# Patient Record
Sex: Female | Born: 1975 | Race: Black or African American | Hispanic: No | Marital: Married
Health system: Midwestern US, Community
[De-identification: ages and names within clinical notes are randomized; demographics above are authoritative.]

## PROBLEM LIST (undated history)

## (undated) DIAGNOSIS — J9801 Acute bronchospasm: Secondary | ICD-10-CM

---

## 2008-07-17 ENCOUNTER — Emergency Department: Payer: Self-pay | Admitting: Emergency Medicine

## 2010-10-09 ENCOUNTER — Emergency Department: Payer: Self-pay | Admitting: Internal Medicine

## 2011-11-19 ENCOUNTER — Emergency Department: Payer: Self-pay | Admitting: Emergency Medicine

## 2012-05-16 ENCOUNTER — Emergency Department: Payer: Self-pay | Admitting: Emergency Medicine

## 2012-05-16 LAB — CBC
HCT: 39.2 % (ref 35.0–47.0)
MCV: 82 fL (ref 80–100)
Platelet: 411 10*3/uL (ref 150–440)
RBC: 4.76 10*6/uL (ref 3.80–5.20)
RDW: 14.9 % — ABNORMAL HIGH (ref 11.5–14.5)
WBC: 7.6 10*3/uL (ref 3.6–11.0)

## 2012-05-16 LAB — COMPREHENSIVE METABOLIC PANEL
Alkaline Phosphatase: 87 U/L (ref 50–136)
BUN: 11 mg/dL (ref 7–18)
Bilirubin,Total: 0.4 mg/dL (ref 0.2–1.0)
Calcium, Total: 8.8 mg/dL (ref 8.5–10.1)
Co2: 27 mmol/L (ref 21–32)
EGFR (African American): >60
SGOT(AST): 16 U/L (ref 15–37)
SGPT (ALT): 20 U/L
Sodium: 139 mmol/L (ref 136–145)

## 2012-05-16 LAB — PREGNANCY, URINE: Pregnancy Test, Urine: NEGATIVE m[IU]/mL

## 2012-11-04 ENCOUNTER — Ambulatory Visit: Payer: Self-pay | Admitting: Obstetrics and Gynecology

## 2012-11-04 LAB — BASIC METABOLIC PANEL
Anion Gap: 9 (ref 7–16)
BUN: 9 mg/dL (ref 7–18)
Co2: 27 mmol/L (ref 21–32)
Creatinine: 0.78 mg/dL (ref 0.60–1.30)
EGFR (African American): >60
Potassium: 3.8 mmol/L (ref 3.5–5.1)
Sodium: 136 mmol/L (ref 136–145)

## 2012-11-04 LAB — CBC
HCT: 20.5 % — ABNORMAL LOW (ref 35.0–47.0)
HGB: 6.1 g/dL — ABNORMAL LOW (ref 12.0–16.0)
MCH: 18.9 pg — ABNORMAL LOW (ref 26.0–34.0)
MCHC: 29.9 g/dL — ABNORMAL LOW (ref 32.0–36.0)
MCV: 63 fL — ABNORMAL LOW (ref 80–100)
Platelet: 482 10*3/uL — ABNORMAL HIGH (ref 150–440)
RBC: 3.24 10*6/uL — ABNORMAL LOW (ref 3.80–5.20)

## 2012-11-09 ENCOUNTER — Observation Stay: Payer: Self-pay | Admitting: Obstetrics and Gynecology

## 2012-11-10 LAB — CBC WITH DIFFERENTIAL/PLATELET
Basophil #: 0.1 10*3/uL (ref 0.0–0.1)
Basophil %: 1.1 %
Basophil %: 1 %
Eosinophil #: 0.1 10*3/uL (ref 0.0–0.7)
Eosinophil %: 1.3 %
Eosinophil %: 1.2 %
HCT: 28.3 % — ABNORMAL LOW (ref 35.0–47.0)
HGB: 9.3 g/dL — ABNORMAL LOW (ref 12.0–16.0)
Lymphocyte %: 39.9 %
Lymphocyte %: 35.9 %
MCH: 22.5 pg — ABNORMAL LOW (ref 26.0–34.0)
MCV: 67 fL — ABNORMAL LOW (ref 80–100)
Monocyte #: 0.6 x10 3/mm (ref 0.2–0.9)
Monocyte %: 8.4 %
Neutrophil #: 3.4 10*3/uL (ref 1.4–6.5)
Neutrophil %: 53.6 %
RBC: 4.14 10*6/uL (ref 3.80–5.20)
WBC: 7 10*3/uL (ref 3.6–11.0)

## 2012-11-11 LAB — BASIC METABOLIC PANEL
Anion Gap: 5 — ABNORMAL LOW (ref 7–16)
BUN: 5 mg/dL — ABNORMAL LOW (ref 7–18)
Calcium, Total: 8.2 mg/dL — ABNORMAL LOW (ref 8.5–10.1)
Chloride: 105 mmol/L (ref 98–107)
Co2: 27 mmol/L (ref 21–32)
Creatinine: 0.78 mg/dL (ref 0.60–1.30)
EGFR (African American): >60
Osmolality: 271 (ref 275–301)

## 2012-11-11 LAB — PATHOLOGY REPORT

## 2013-01-10 IMAGING — CT CT CHEST W/ CM
1 series · 15 of 34 positions shown, 19 images · non-contrast
Comparison: none

REASON FOR EXAM: chest pain, elevated d-dimer
COMMENTS:

PROCEDURE:     CT  - CT CHEST (FOR PE) W  - May 16, 2012 [DATE]
RESULT:     Chest CT dated 05/16/2012.
TECHNIQUE: Helical 3 mm sections were obtained the thoracic inlet through
the lung bases status post intravenous administration of 85 mL of Ksovue-SNU.

[Series 4: soft tissue · axial · 0.68mm/px · z∈[-313,-70]mm · 15 of 97 slices shown, 19 images]
[im 8/97  mediastinal]
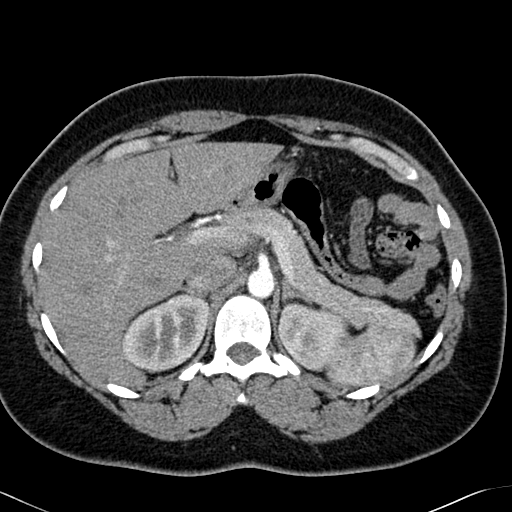
[im 8/97  lung]
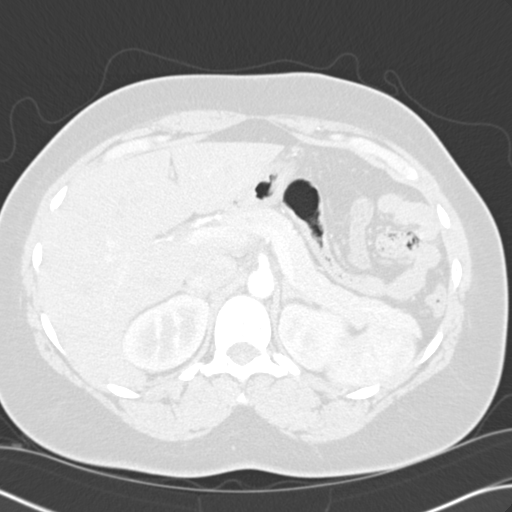
[im 15/97  lung]
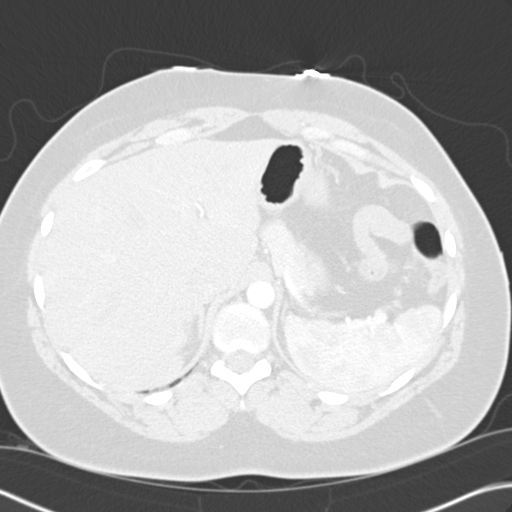
[im 20/97  lung]
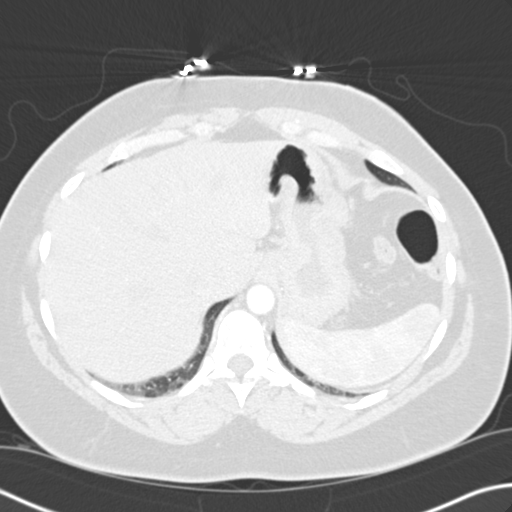
[im 25/97  lung]
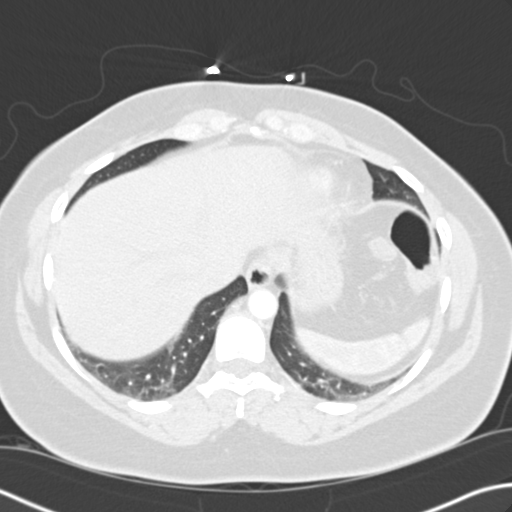
[im 33/97  mediastinal]
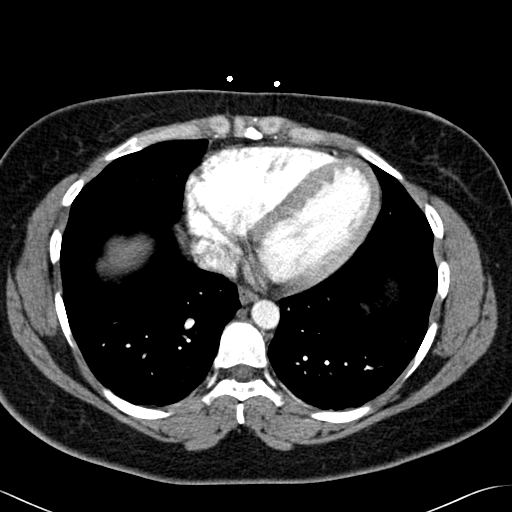
[im 33/97  lung]
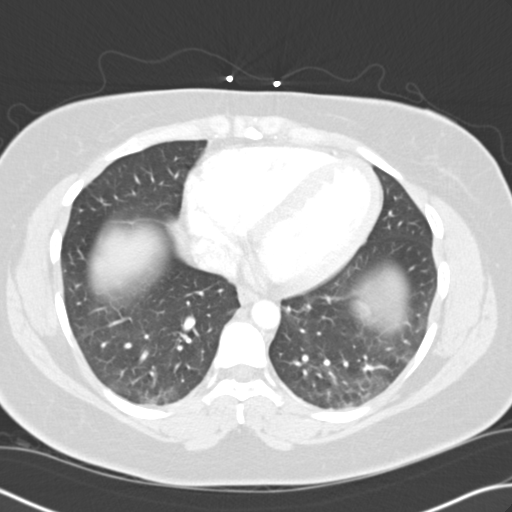
[im 39/97  lung]
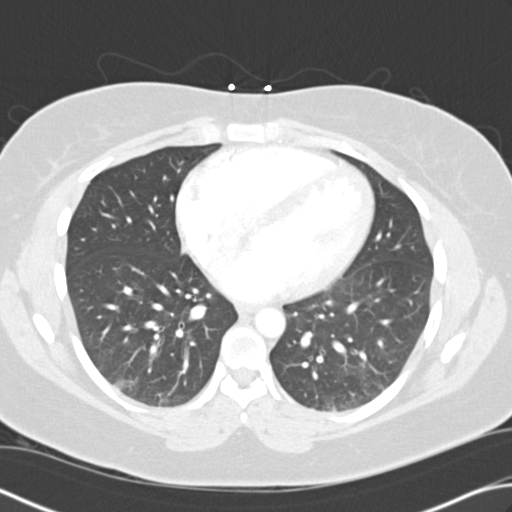
[im 43/97  lung]
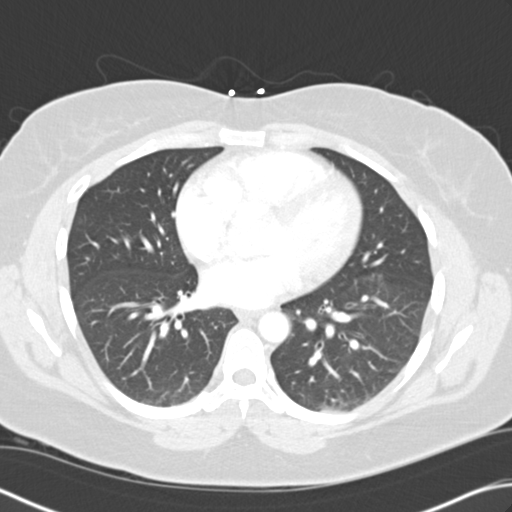
[im 50/97  lung]
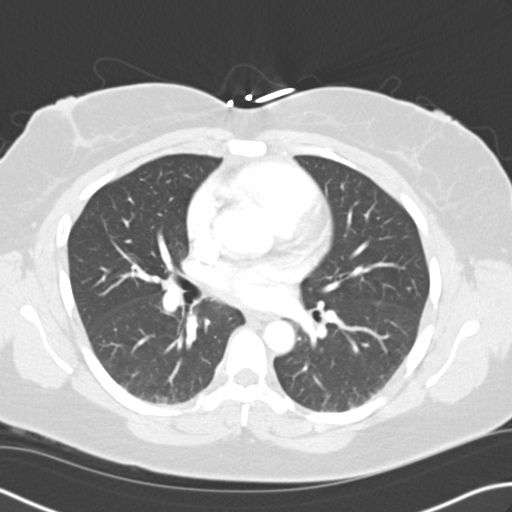
[im 54/97  mediastinal]
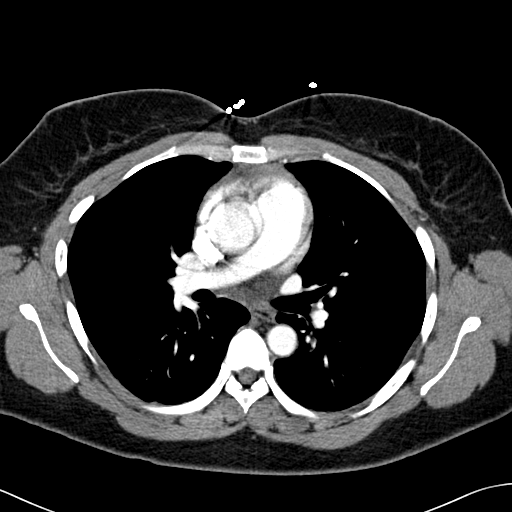
[im 54/97  lung]
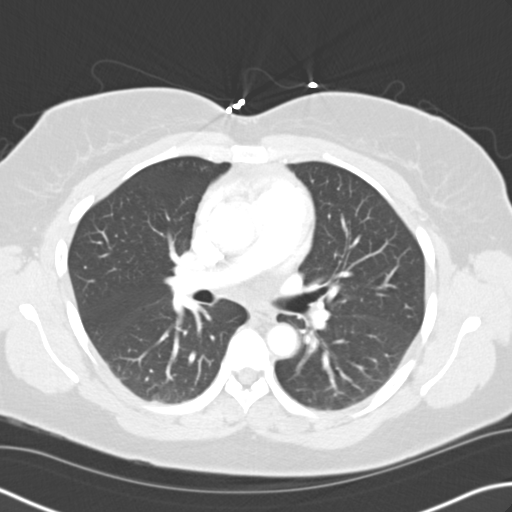
[im 58/97  lung]
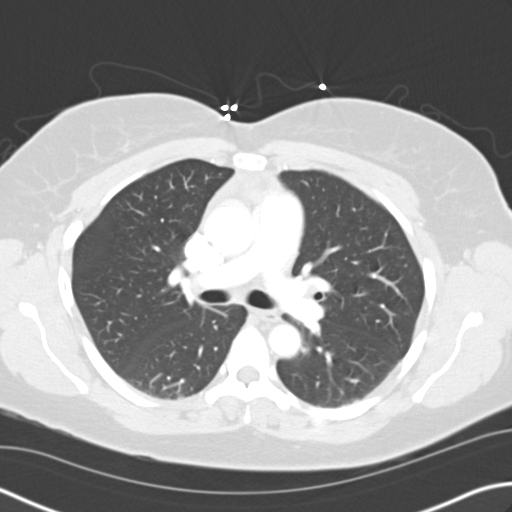
[im 65/97  lung]
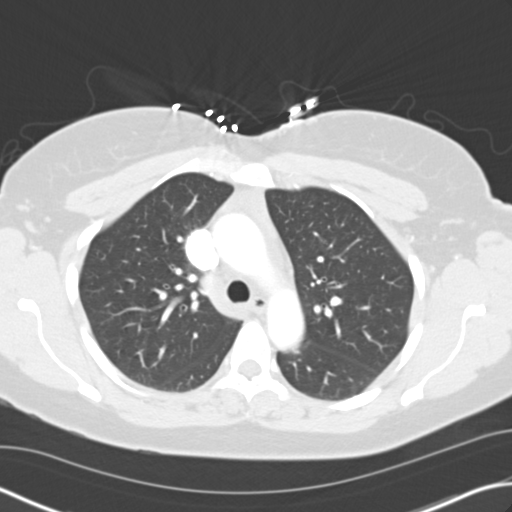
[im 72/97  lung]
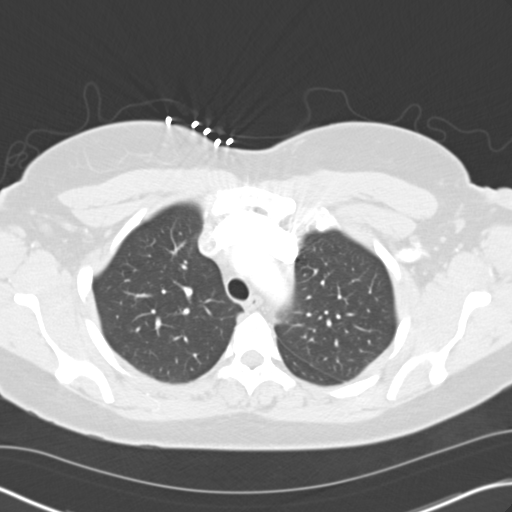
[im 77/97  mediastinal]
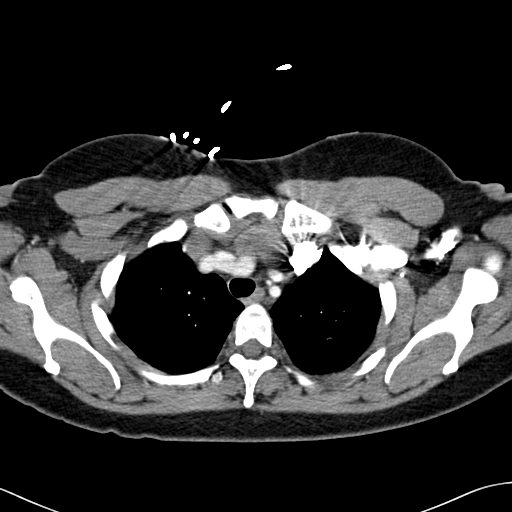
[im 77/97  lung]
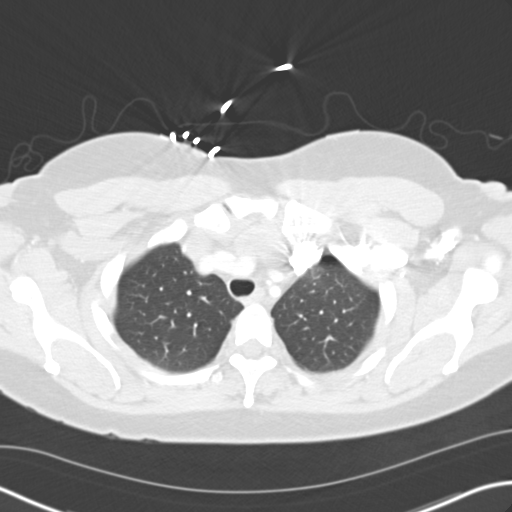
[im 82/97  lung]
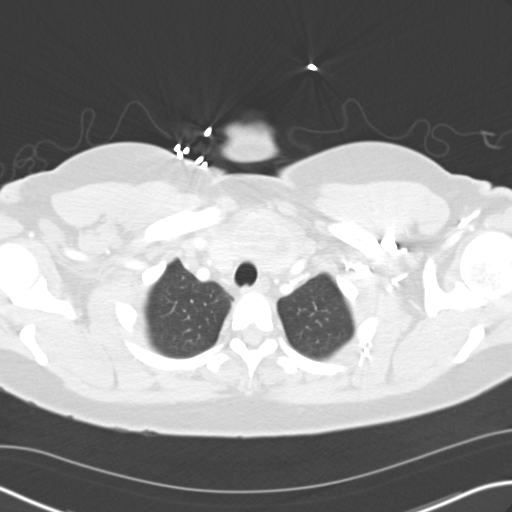
[im 89/97  lung]
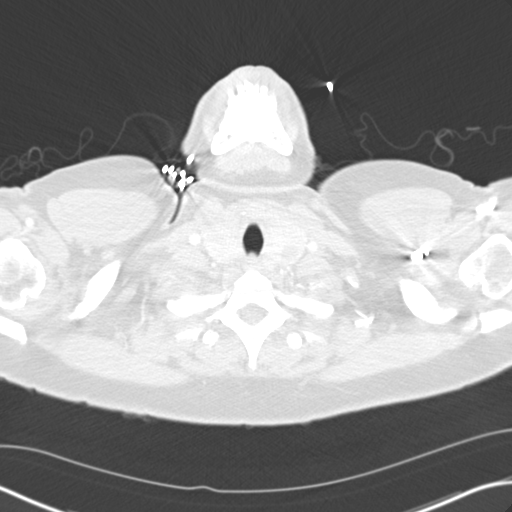

[15 of 34 positions shown; findings below may reference images not displayed]

FINDINGS: The mediastinum and hilar regions and structures demonstrate no
evidence of mediastinal nor hilar adenopathy nor masses.

Evaluate the thoracic inlet demonstrates diffuse enlargement of the thyroid
with masslike enlargement along the inferior aspect of the left lobe of the
thyroid. If clinically warranted further evaluation with schedule thyroid
ultrasound is recommended and correlation with thyroid function tests.

There is no evidence of filling defects within the main, lobar, or segmental
pulmonary arteries. The lung parenchyma demonstrates hypoventilation within
the lung bases. No focal regions of consolidation no focal masses or nodules
identified. The finds within the lung base alternatively may represent mild
infiltrates.

Visualized upper abdominal viscera demonstrates no gross abnormalities.
IMPRESSION: 1. No CT evidence of pulmonary nodule embolic disease
2. Diffuse enlargement of the thyroid with a focal masslike appearance along
the inferior pole on the left further evaluation as described above.
3. Likely hypoventilation in the lung bases.

## 2014-03-05 ENCOUNTER — Emergency Department: Payer: Self-pay | Admitting: Emergency Medicine

## 2014-03-05 LAB — BASIC METABOLIC PANEL
Anion Gap: 1 — ABNORMAL LOW (ref 7–16)
BUN: 9 mg/dL (ref 7–18)
CHLORIDE: 102 mmol/L (ref 98–107)
CO2: 33 mmol/L — AB (ref 21–32)
Calcium, Total: 8.7 mg/dL (ref 8.5–10.1)
Creatinine: 0.84 mg/dL (ref 0.60–1.30)
EGFR (Non-African Amer.): >60
GLUCOSE: 101 mg/dL — AB (ref 65–99)
Osmolality: 271 (ref 275–301)
Potassium: 3.9 mmol/L (ref 3.5–5.1)
Sodium: 136 mmol/L (ref 136–145)

## 2014-03-05 LAB — CBC WITH DIFFERENTIAL/PLATELET
Basophil #: 0.1 10*3/uL (ref 0.0–0.1)
Basophil %: 0.9 %
EOS PCT: 1.7 %
Eosinophil #: 0.1 10*3/uL (ref 0.0–0.7)
HCT: 38.9 % (ref 35.0–47.0)
HGB: 13.3 g/dL (ref 12.0–16.0)
Lymphocyte #: 2.9 10*3/uL (ref 1.0–3.6)
Lymphocyte %: 43.5 %
MCH: 27.8 pg (ref 26.0–34.0)
MCHC: 34.2 g/dL (ref 32.0–36.0)
MCV: 81 fL (ref 80–100)
Monocyte #: 0.5 x10 3/mm (ref 0.2–0.9)
Monocyte %: 7 %
Neutrophil #: 3.2 10*3/uL (ref 1.4–6.5)
Neutrophil %: 46.9 %
Platelet: 379 10*3/uL (ref 150–440)
RBC: 4.78 10*6/uL (ref 3.80–5.20)
RDW: 13.9 % (ref 11.5–14.5)
WBC: 6.7 10*3/uL (ref 3.6–11.0)

## 2014-03-05 LAB — URINALYSIS, COMPLETE
BLOOD: NEGATIVE
Bilirubin,UR: NEGATIVE
GLUCOSE, UR: NEGATIVE mg/dL (ref 0–75)
KETONE: NEGATIVE
Nitrite: NEGATIVE
PROTEIN: NEGATIVE
Ph: 6 (ref 4.5–8.0)
Specific Gravity: 1.013 (ref 1.003–1.030)
Squamous Epithelial: 3
WBC UR: 2 /HPF (ref 0–5)

## 2014-03-05 LAB — TROPONIN I: Troponin-I: <0.02 ng/mL

## 2014-10-30 IMAGING — CR DG CHEST 2V
1 series · 2 of 2 positions shown · non-contrast
Comparison: Chest radiograph and chest CT May 16, 2012

CLINICAL DATA: Fever

EXAM:
CHEST  2 VIEW

[Series 1: pa · 0.17mm/px · 2 of 2 slices shown]
[im 1/2]
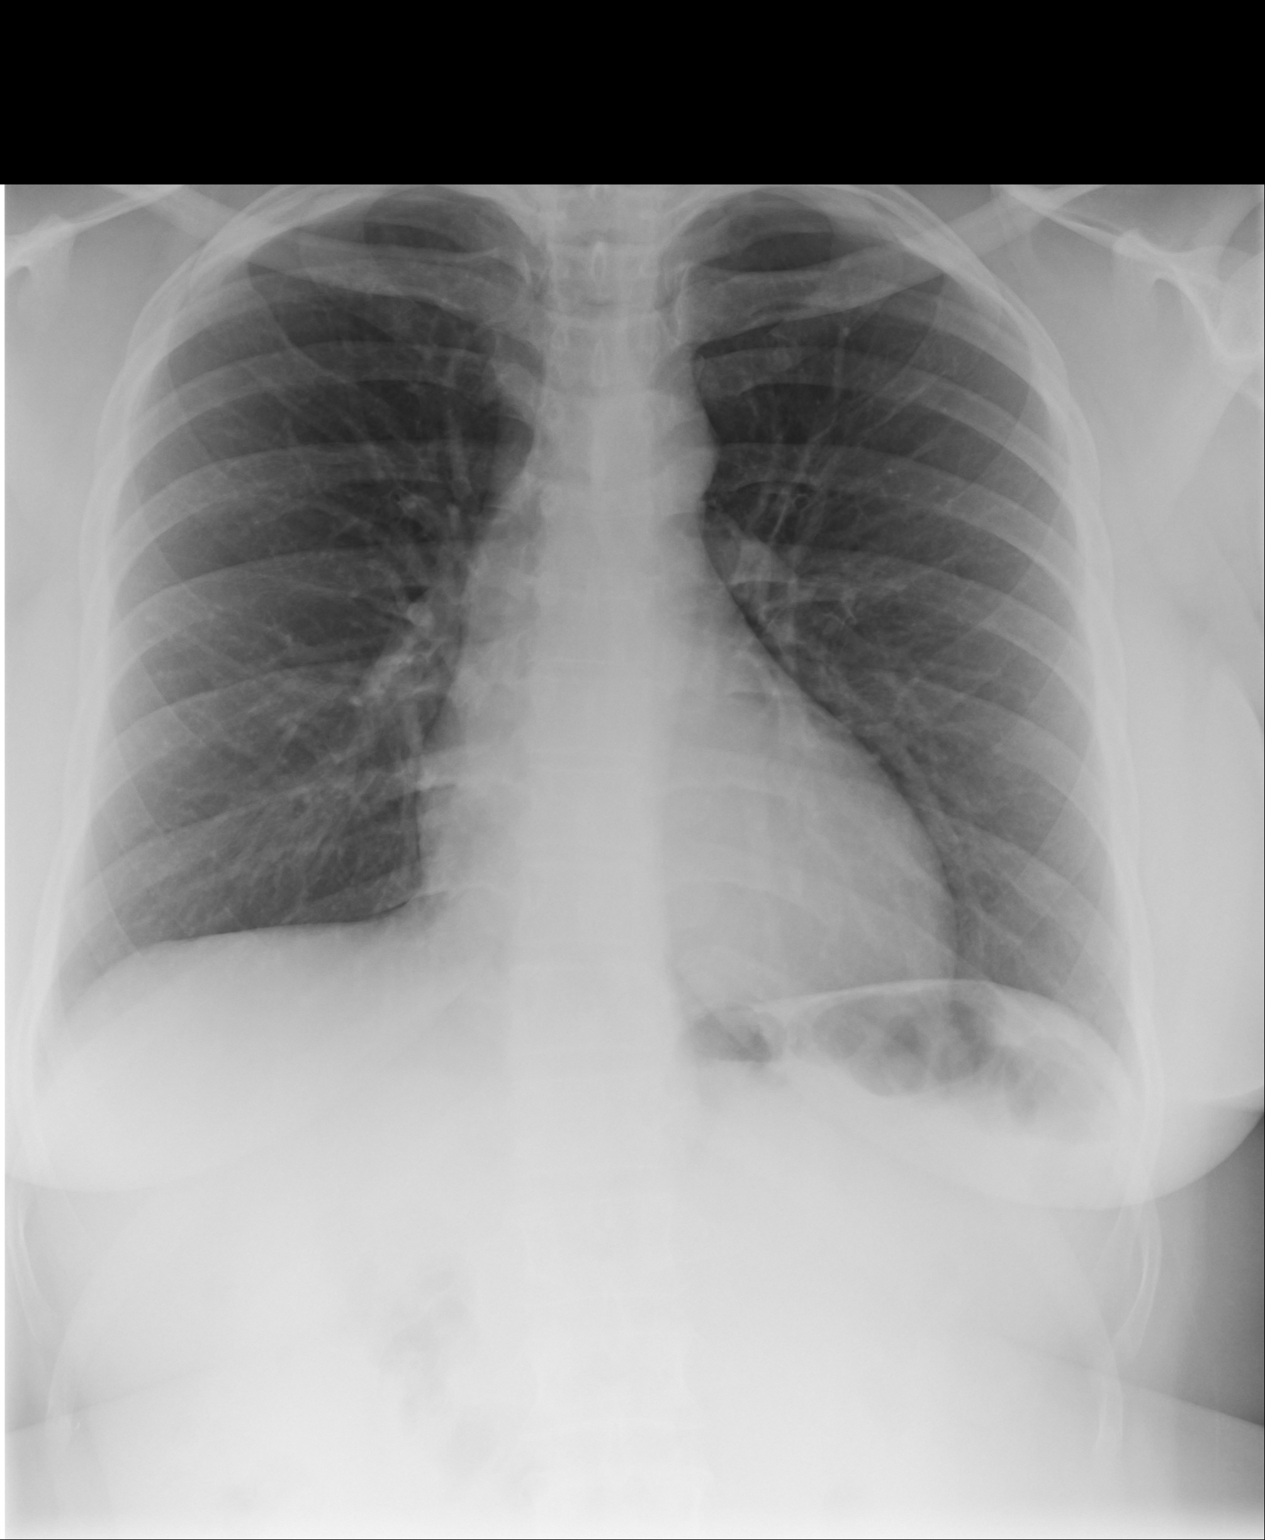
[im 2/2]
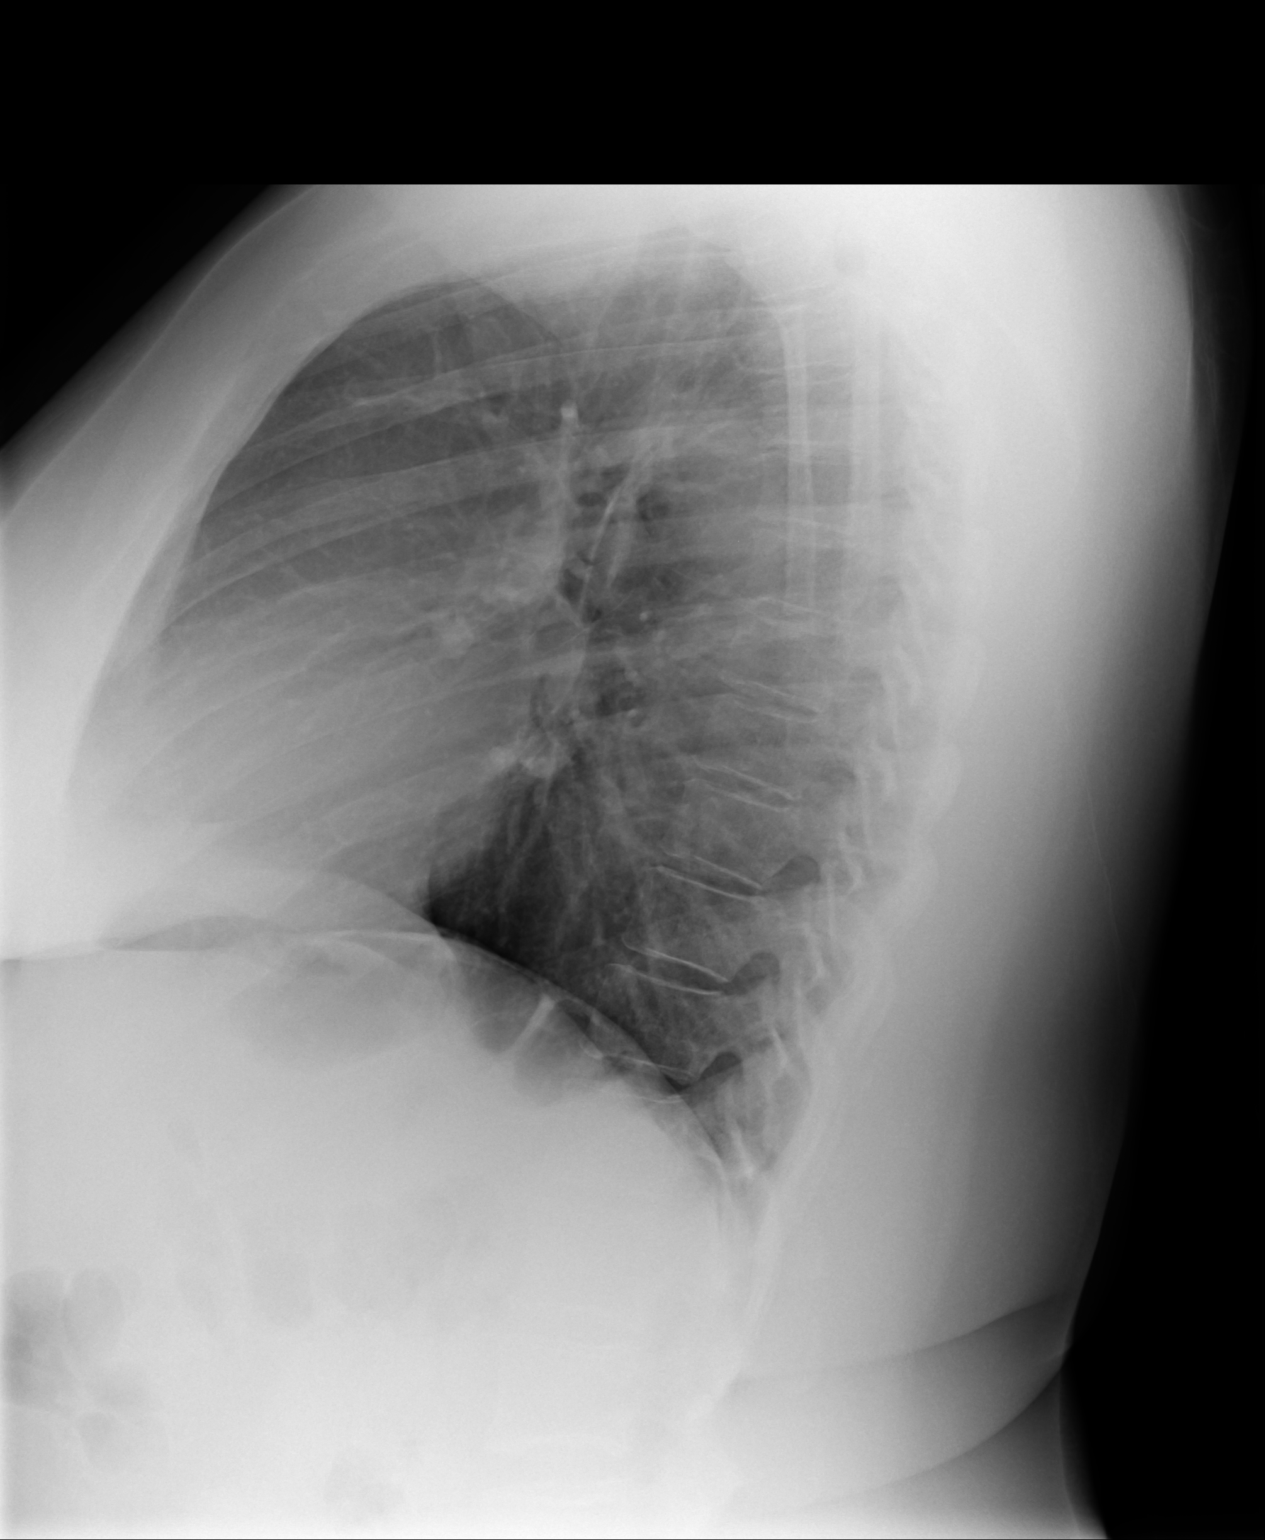

[2 of 2 positions shown; findings below may reference images not displayed]

FINDINGS: Lungs are clear. Heart size and pulmonary vascularity are normal. No
adenopathy. No bone lesions.
IMPRESSION: No abnormality noted.

## 2015-04-11 NOTE — Op Note (Signed)
PATIENT NAME:  Tammy Stanley, Tammy Stanley MR#:  [id number] DATE OF BIRTH:  [birth year]  DATE OF PROCEDURE:  11/10/2012  PREOPERATIVE DIAGNOSES:  1. Menorrhagia to anemia requiring preoperative blood transfusion for a hemoglobin of 6.1. 2. Uterine leiomyomata.   POSTOPERATIVE DIAGNOSES:  1. Menorrhagia to anemia requiring preoperative blood transfusion for a hemoglobin of 6.1. 2. Uterine leiomyomata.   PROCEDURES: Laparoscopic supracervical hysterectomy and cystoscopy.   SURGEON: Vena Austria, MD  ASSISTANT: Kate Sable, MD  ANESTHESIA: General.   ESTIMATED BLOOD LOSS: 50 mL.  OPERATIVE FLUIDS: 1600 mL of crystalloid.   URINE OUTPUT: 150 mL of clear urine.   COMPLICATIONS: None.   INTRAOPERATIVE FINDINGS: Normal tubes, ovaries, small amount of uterine enlargement secondary to known uterine fibroids. Normal bladder on cystoscopy with bilateral ureteral efflux.   PREOPERATIVE ANTIBIOTICS: 2 grams Ancef.  DRAINS OR TUBES: Foley to gravity drainage.   IMPLANTS: None.   SPECIMENS REMOVED: Uterus without cervix, morcellated specimen.  CONDITION FOLLOWING PROCEDURE: Stable.   PROCEDURE IN DETAIL: Risks, benefits, and alternatives of the procedure were discussed with the patient prior to proceeding to the Operating Room. The patient was taken to the Operating Room where general anesthesia was administered. She was positioned in the dorsal lithotomy position, prepped and draped in the usual sterile fashion. A time out was performed. A Foley catheter was placed and a sterile speculum was used to visualize the anterior lip of the cervix, which was grasped with a single-tooth tenaculum. A Hulka tenaculum was then placed and the single-tooth tenaculum was removed. The sterile speculum was removed as well. Attention was turned to the patient's abdomen. The umbilicus was infiltrated with 1% lidocaine. Following this a stab incision was made at the base of the umbilicus. Entering the peritoneum was  achieved using a 5 mm XL trocar under direct visualization. Following this pneumoperitoneum was established. A 5 mm right assistant port and an 11 mm left assistant port were then placed under direct visualization. Inspection of the abdomen noted the above findings. Attention was turned to the patient's right cornea. The uteroovarian fallopian tube and round ligament were transected using a 5 mm Harmonic device. The anterior leaf of the broad ligament was then dissected down to the level of the internal cervical os. The uterine artery was skeletonized and then ligated using bipolar energy. Following this the uterine artery was transected. The left adnexal structures were dissected off the uterus in a similar fashion. The anterior leaf of the broad ligament was also dissected down to the level of the internal cervical os. A bladder flap was created. The uterine artery was skeletonized and then ligated using bipolar energy. The uterine artery was then transected. The uterine specimen was then transected off the cervical stump using the 5 mm LigaSure device. Following this the uterus specimen was morcellated using a Gynecare morcellator. Following this the pelvis was irrigated. The pedicles were inspected and noted to be hemostatic. The cervical os was cauterized using bipolar energy. Following this the 11 mm port site was closed using Endo Close device. No fascial defect was noted after closure of the fascia. Attention was then turned to the cystoscopy portion of the case. Cystoscopy revealed bilateral efflux of methylene blue dye from both ureters. The bladder was intact without defects. The 5 mm port sites were then dressed with Dermabond and the 11 mm           port sites closed using a 4-0 Monocryl and Dermabond. Sponge, needle, and instrument counts were correct x2.  The patient tolerated the procedure well and was taken to the Recovery Room in stable condition.  ____________________________ Florina OuAndreas M.  Bonney AidStaebler, MD ams:slb D: 11/10/2012 22:35:06 ET T: 11/11/2012 10:40:04 ET JOB#: [id number]  cc: Florina OuAndreas M. Bonney AidStaebler, MD, <Dictator> Carmel SacramentoANDREAS Cathrine MusterM Nitza Schmid MD ELECTRONICALLY SIGNED 11/26/2012 15:37

## 2019-11-09 NOTE — ED Provider Notes (Signed)
Chest pain        Patient:   Gabrielle Olsen, Gabrielle Olsen             MRN: [id number]            FIN: [id number]               Age:   43 years     Sex:  Female     DOB:  11/14/1976   Associated Diagnoses:   Muscle strain of anterior chest wall   Author:   Caleb Popp      Basic Information   Additional information: Chief Complaint from Nursing Triage Note   Chief Complaint  Chief Complaint: PT c/o CP starting this morning. 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 Non-radiating in the center of her chest when she moves her arms. (11/09/19 11:51:00).      History of Present Illness   The patient presents with This patient states that she had onset of chest pain at approximately 10:00 this morning after lifting a large plate full of corn.  Patient states that the pain worsens with left arm movement.  Denies any history of shortness of breath.  She does have hypertension and hypercholesterolemia but no prior history of coronary disease.  Denies any history of cough fever.  No other complaints.  The onset was 2.5  hours ago and abrupt.  The course/duration of symptoms is constant.  Location: chest. Radiating pain: none. The character of symptoms is sharp.  The degree at onset was moderate.  The degree at maximum was severe.  The degree at present is moderate.  The exacerbating factor is movement.  The relieving factor is none.  Risk factors consist of hypertension, obesity, hyperlipidemia and not coronary artery disease.  Associated symptoms: denies shortness of breath, denies vomiting and denies diaphoresis.        Review of Systems   Constitutional symptoms:  No fever, no chills.    Respiratory symptoms:  No shortness of breath, no cough.    Cardiovascular symptoms:  Chest pain, No palpitations,    Gastrointestinal symptoms:  No abdominal pain,    Musculoskeletal symptoms:  No back pain, no Joint pain.              Additional review of systems information: All other systems reviewed  and otherwise negative.      Health Status   Allergies:    Allergic Reactions (Selected)  Severe  Codeine- Anaphylactic reaction..   Medications:  (Selected)   Inpatient Medications  Ordered  Motrin: 800 mg, 1 tabs, Oral, Once  Norco 325 mg-5 mg oral tablet: 2 tabs, Oral, Once.      Past Medical/ Family/ Social History   Medical history: Reviewed as documented in chart.   Surgical history: Reviewed as documented in chart.   Family history: Not significant.   Social history: Reviewed as documented in chart.   Problem list:    No qualifying data available  .      Physical Examination               Vital Signs   Vital Signs   10/19/2535 64:40 EST Systolic Blood Pressure 347 mmHg  >HHI    Diastolic Blood Pressure 425 mmHg  >HHI    Heart Rate Monitored 78 bpm    Respiratory Rate 17 br/min    SpO2 100 %   .   Measurements   11/09/2019  11:55 EST Body Mass Index est meas 41.23 kg/m2    Body Mass Index Measured 41.23 kg/m2   11/09/2019 11:51 EST Height/Length Measured 167 cm    Weight Dosing 115 kg   .   Basic Oxygen Information   11/09/2019 11:51 EST SpO2 100 %    Oxygen Therapy Room air   .   General:  Alert, no acute distress.    Skin:  Warm, dry.    Cardiovascular:  Regular rate and rhythm, No murmur.    Respiratory:  Lungs are clear to auscultation, respirations are non-labored, breath sounds are equal.    Chest wall:  Patient has reproducible tenderness to the left anterior chest wall with worsening pain with arm abduction.  There is no evidence of any presents..   Musculoskeletal:  Normal ROM, normal strength.    Gastrointestinal:  Soft, Nontender.    Neurological:  Alert and oriented to person, place, time, and situation, normal motor observed, normal speech observed.       Medical Decision Making   Electrocardiogram:  See ECG ED Review.   Radiology results:  Rad Results (ST)   XR Chest 1 View Portable  ?  11/09/19 12:52:12  Chest AP: 11/09/19    INDICATION:Chest pain.    COMPARISON:  None.    FINDINGS:    Lungs/Pleura: Low lung volumes. Bibasilar opacities likely representing  atelectasis. No focal consolidation. No pleural effusions. No pneumothorax.    Cardiomediastinum: Unremarkable.    Bones: No acute osseous abnormality.    IMPRESSION:  Low lung volumes. Bibasilar opacities likely representing atelectasis. No focal  consolidation.  ?  Signed By: Katha Cabal      Reexamination/ Reevaluation   Vital signs   Basic Oxygen Information   11/09/2019 11:51 EST SpO2 100 %    Oxygen Therapy Room air      Patient has evidence of left-sided chest wall strain.  Diagnostic studies were found to be noncontributory      Impression and Plan   Diagnosis   Muscle strain of anterior chest wall (ICD10-CM S29.011A, Discharge, Medical)   Plan   Condition: Stable.    Disposition: Discharged: Time  11/09/2019 15:07:00, to home.    Prescriptions: Launch prescriptions   Pharmacy:  Flexeril 10 mg oral tablet (Prescribe): 10 mg, 1 tabs, Oral, TID, for 10 days, PRN: spasm, 20 tabs, 0 Refill(s)  ibuprofen 800 mg oral tablet (Prescribe): 800 mg, 1 tabs, Oral, TID, for 7 days, PRN: moderate pain (4-7), 20 tabs, 0 Refill(s).    Patient was given the following educational materials: Muscle Strain, Easy-to-Read, Muscle Strain, Easy-to-Read.    Limitations: No work, For  2  days.    Follow up with: Follow-up in Emergency Department , only if needed Medications as prescribed for chest wall strain    Apply local moist heat to the area of discomfort    Bedrest for 48 hours    Follow-up to the emergency room otherwise as needed especially if there is any shortness of breath, fever or severe worsening symptoms.    Counseled: Patient, Regarding diagnosis, Regarding diagnostic results, Regarding treatment plan, Regarding prescription, Patient indicated understanding of instructions.    Signature Line     Electronically Signed on 11/09/2019 03:09 PM EST   ________________________________________________   Soyla Dryer               Modified by: Lynn Ito V on 11/09/2019 03:09 PM EST

## 2019-11-09 NOTE — Discharge Summary (Signed)
ED Clinical Summary                     [address]  [address]  [place], [place] [address]  [phone]          PERSON INFORMATION  Name: Gabrielle Olsen, Gabrielle Olsen Age:  43 Years DOB: 16-Jul-1976   Sex: Female Language: English PCP: PCP,  NONE   Marital Status: Married Phone: [phone] Med Service: Harle Stanford   MRN: [id number] Acct# [id number] Arrival: 11/09/2019 11:45:00   Visit Reason: Chest pain; CP Acuity: 3 LOS: 000 03:37   Address:    [address] [place] [phone]   Diagnosis:    Muscle strain of anterior chest wall  Medications:          New Medications  Printed Prescriptions  cyclobenzaprine (Flexeril 10 mg oral tablet) 1 Tabs Oral (given by mouth) 3 times a day as needed spasm for 10 Days. Refills: 0.  Last Dose:____________________  ibuprofen (ibuprofen 800 mg oral tablet) 1 Tabs Oral (given by mouth) 3 times a day as needed moderate pain (4-7) for 7 Days. Refills: 0.  Last Dose:____________________      Medications Administered During Visit:                Medication Dose Route   ibuprofen 800 mg Oral   HYDROcodone-acetaminophen 2 tabs Oral               Allergies      codeine (Anaphylactic reaction)      Major Tests and Procedures:  The following procedures and tests were performed during your ED visit.  COMMON PROCEDURES%>  COMMON PROCEDURES COMMENTS%>                PROVIDER INFORMATION               Provider Role Assigned Loletha Carrow, [facility] ED Provider 11/09/2019 11:54:45    Willey Blade M ED Provider 11/09/2019 11:55:02 11/09/2019 11:55:05   Chaney Born, RN, Joellyn Haff ED Nurse 11/09/2019 11:56:42        Attending Physician:  Soyla Dryer      Admit Doc  CHAG-MD,  [facility]     Consulting Doc       VITALS INFORMATION  Vital Sign Triage Latest   Temp Oral ORAL_1%> ORAL%>   Temp Temporal TEMPORAL_1%> TEMPORAL%>   Temp Intravascular INTRAVASCULAR_1%> INTRAVASCULAR%>   Temp Axillary AXILLARY_1%> AXILLARY%>   Temp Rectal RECTAL_1%> RECTAL%>   02 Sat 100  % 98 %   Respiratory Rate RATE_1%> RATE%>   Peripheral Pulse Rate PULSE RATE_1%> PULSE RATE%>   Apical Heart Rate HEART RATE_1%> HEART RATE%>   Blood Pressure BLOOD PRESSURE_1%>/ BLOOD PRESSURE_1%>117 mmHg BLOOD PRESSURE%> / BLOOD PRESSURE%>72 mmHg                 Immunizations      No Immunizations Documented This Visit          DISCHARGE INFORMATION   Discharge Disposition: H Outpt-Sent Home   Discharge Location:  Home   Discharge Date and Time:  11/09/2019 15:22:27   ED Checkout Date and Time:  11/09/2019 15:22:27     DEPART REASON INCOMPLETE INFORMATION               Depart Action Incomplete Reason   Interactive View/I&O Recently assessed               Problems  No Problems Documented              Smoking Status      No Smoking Status Documented         PATIENT EDUCATION INFORMATION  Instructions:     Muscle Strain, Easy-to-Read     Follow up:                   With: Address: When:   Follow-up in Emergency Department  , only if needed   Comments:   Medications as prescribed for chest wall strain     Apply local moist heat to the area of discomfort     Bedrest for 48 hours     Follow-up to the emergency room otherwise as needed especially if there is any shortness of breath, fever or severe worsening symptoms              ED PROVIDER DOCUMENTATION     Patient:   Gabrielle, Olsen             MRN: [id number]            FIN: [id number]               Age:   30 years     Sex:  Female     DOB:  [birth year]   Associated Diagnoses:   Muscle strain of anterior chest wall   Author:   Caleb Popp      Basic Information   Additional information: Chief Complaint from Nursing Triage Note   Chief Complaint  Chief Complaint: PT c/o CP starting this morning. 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 Non-radiating in the center of her chest when she moves her arms. (11/09/19 11:51:00).      History of Present Illness   The patient presents with This patient states that she had onset  of chest pain at approximately 10:00 this morning after lifting a large plate full of corn.  Patient states that the pain worsens with left arm movement.  Denies any history of shortness of breath.  She does have hypertension and hypercholesterolemia but no prior history of coronary disease.  Denies any history of cough fever.  No other complaints.  The onset was 2.5  hours ago and abrupt.  The course/duration of symptoms is constant.  Location: chest. Radiating pain: none. The character of symptoms is sharp.  The degree at onset was moderate.  The degree at maximum was severe.  The degree at present is moderate.  The exacerbating factor is movement.  The relieving factor is none.  Risk factors consist of hypertension, obesity, hyperlipidemia and not coronary artery disease.  Associated symptoms: denies shortness of breath, denies vomiting and denies diaphoresis.        Review of Systems   Constitutional symptoms:  No fever, no chills.    Respiratory symptoms:  No shortness of breath, no cough.    Cardiovascular symptoms:  Chest pain, No palpitations,    Gastrointestinal symptoms:  No abdominal pain,    Musculoskeletal symptoms:  No back pain, no Joint pain.              Additional review of systems information: All other systems reviewed and otherwise negative.      Health Status   Allergies:    Allergic Reactions (Selected)  Severe  Codeine- Anaphylactic reaction..   Medications:  (Selected)   Inpatient Medications  Ordered  Motrin: 800 mg, 1 tabs, Oral, Once  Norco 325 mg-5 mg oral tablet: 2 tabs, Oral, Once.      Past Medical/ Family/ Social History   Medical history: Reviewed as documented in chart.   Surgical history: Reviewed as documented in chart.   Family history: Not significant.   Social history: Reviewed as documented in chart.   Problem list:    No qualifying data available  .      Physical Examination               Vital Signs   Vital Signs   11/09/2019 11:51 EST Systolic Blood Pressure 188 mmHg  >HHI     Diastolic Blood Pressure 117 mmHg  >HHI    Heart Rate Monitored 78 bpm    Respiratory Rate 17 br/min    SpO2 100 %   .   Measurements   11/09/2019 11:55 EST Body Mass Index est meas 41.23 kg/m2    Body Mass Index Measured 41.23 kg/m2   11/09/2019 11:51 EST Height/Length Measured 167 cm    Weight Dosing 115 kg   .   Basic Oxygen Information   11/09/2019 11:51 EST SpO2 100 %    Oxygen Therapy Room air   .   General:  Alert, no acute distress.    Skin:  Warm, dry.    Cardiovascular:  Regular rate and rhythm, No murmur.    Respiratory:  Lungs are clear to auscultation, respirations are non-labored, breath sounds are equal.    Chest wall:  Patient has reproducible tenderness to the left anterior chest wall with worsening pain with arm abduction.  There is no evidence of any presents..   Musculoskeletal:  Normal ROM, normal strength.    Gastrointestinal:  Soft, Nontender.    Neurological:  Alert and oriented to person, place, time, and situation, normal motor observed, normal speech observed.       Medical Decision Making   Electrocardiogram:  See ECG ED Review.   Radiology results:  Rad Results (ST)   XR Chest 1 View Portable  ?  11/09/19 12:52:12  Chest AP: 11/09/19    INDICATION:Chest pain.    COMPARISON: None.    FINDINGS:    Lungs/Pleura: Low lung volumes. Bibasilar opacities likely representing  atelectasis. No focal consolidation. No pleural effusions. No pneumothorax.    Cardiomediastinum: Unremarkable.    Bones: No acute osseous abnormality.    IMPRESSION:  Low lung volumes. Bibasilar opacities likely representing atelectasis. No focal  consolidation.  ?  Signed By: Katha CabalKESLER-MD, THOMAS J  .      Reexamination/ Reevaluation   Vital signs   Basic Oxygen Information   11/09/2019 11:51 EST SpO2 100 %    Oxygen Therapy Room air      Patient has evidence of left-sided chest wall strain.  Diagnostic studies were found to be noncontributory      Impression and Plan   Diagnosis   Muscle strain of anterior chest wall  (ICD10-CM S29.011A, Discharge, Medical)   Plan   Condition: Stable.    Disposition: Discharged: Time  11/09/2019 15:07:00, to home.    Prescriptions: Launch prescriptions   Pharmacy:  Flexeril 10 mg oral tablet (Prescribe): 10 mg, 1 tabs, Oral, TID, for 10 days, PRN: spasm, 20 tabs, 0 Refill(s)  ibuprofen 800 mg oral tablet (Prescribe): 800 mg, 1 tabs, Oral, TID, for 7 days, PRN: moderate pain (4-7), 20 tabs, 0 Refill(s).    Patient was given the following educational materials: Muscle Strain, Easy-to-Read,  Muscle Strain, Easy-to-Read.    Limitations: No work, For  2  days.    Follow up with: Follow-up in Emergency Department , only if needed Medications as prescribed for chest wall strain    Apply local moist heat to the area of discomfort    Bedrest for 48 hours    Follow-up to the emergency room otherwise as needed especially if there is any shortness of breath, fever or severe worsening symptoms.    Counseled: Patient, Regarding diagnosis, Regarding diagnostic results, Regarding treatment plan, Regarding prescription, Patient indicated understanding of instructions.

## 2019-11-09 NOTE — ED Notes (Signed)
ED Triage Note       ED Triage Adult Entered On:  11/09/2019 11:55 EST    Performed On:  11/09/2019 11:51 EST by Chaney Born, RN, Joellyn Haff               Triage   Chief Complaint :   PT c/o CP starting this morning. 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 Non-radiating in the center of her chest when she moves her arms.   Numeric Rating Pain Scale :   8   [place] Mode of Arrival :   Ambulance   Infectious Disease Documentation :   Document assessment   Heart Rate Monitored :   78 bpm   Respiratory Rate :   17 br/min   Systolic Blood Pressure :   188 mmHg (>HHI)    Diastolic Blood Pressure :   117 mmHg (>HHI)    SpO2 :   100 %   Oxygen Therapy :   Room air   Patient presentation :   None of the above   Chief Complaint or Presentation suggest infection :   No   Dosing Weight Obtained By :   Patient stated   Weight Dosing :   115 kg(Converted to: 253 lb 9 oz)    Height :   167 cm(Converted to: 5 ft 6 in)    Body Mass Index Dosing :   41 kg/m2   Clayborn Bigness - 11/09/2019 11:51 EST   DCP GENERIC CODE   Tracking Acuity :   3   Tracking Group :   ED [address] Tracking Group   [place], RN, Joellyn Haff - 11/09/2019 11:51 EST   ED General Section :   Document assessment   Pregnancy Status :   Patient denies   ED Allergies Section :   Document assessment   ED Reason for Visit Section :   Document assessment   ED Quick Assessment :   Patient appears awake, alert, oriented to baseline. Skin warm and dry. Moves all extremities. Respiration even and unlabored. Appears in no apparent distress.   Clayborn Bigness - 11/09/2019 11:51 EST   PTA/Triage Treatments   ED PTA Pre-Arrival Service :   [facility]   [place], [place], Joellyn Haff - 11/09/2019 11:51 EST   ID Risk Screen Symptoms   Recent Travel History :   No recent travel   Close Contact with COVID-19 ID :   No   Last 14 days COVID-19 ID :   No   TB Symptom Screen :   No symptoms   C. diff Symptom/History ID :   Neither of the  above   [place], RN, Joellyn Haff - 11/09/2019 11:51 EST   Allergies   (As Of: 11/09/2019 11:55:43 EST)   Allergies (Active)   codeine  Estimated Onset Date:   Unspecified ; Reactions:   Anaphylactic reaction ; Created By:   Chaney Born, RN, Joellyn Haff; Reaction Status:   Active ; Category:   Drug ; Substance:   codeine ; Type:   Allergy ; Severity:   Severe ; Updated By:   Clayborn Bigness; Reviewed Date:   11/09/2019 11:54 EST        Psycho-Social   Last 3 mo, thoughts killing self/others :   Patient denies   Feels Unsafe at Home :   No   ED Behavioral Activity Rating Scale :  4 - Quiet and awake (normal level of activity)   Chaney Born, RN, Joellyn Haff - 11/09/2019 11:51 EST   ED Reason for Visit   (As Of: 11/09/2019 11:55:43 EST)   Diagnoses(Active)    Chest pain  Date:   11/09/2019 ; Diagnosis Type:   Reason For Visit ; Confirmation:   Complaint of ; Clinical Dx:   Chest pain ; Classification:   Medical ; Clinical Service:   Emergency medicine ; Code:   PNED ; Probability:   0 ; Diagnosis Code:   [id number]

## 2019-11-09 NOTE — ED Notes (Signed)
ED Pre-Arrival Note        Pre-Arrival Summary    Name:  Gabrielle Olsen,    Current Date:  11/09/2019 11:48:07 EST  Gender:  Female  Date of Birth:    Age:  43  Pre-Arrival Type:  EMS  ETA:  11/09/2019 11:59:00 EST  Primary Care Physician:    Presenting Problem:  cp with movement  Pre-Arrival User:  Gasper Sells  Referring Source:    Location:  [place]            PreArrival Communication Form  Emergency Department        Additional Patient Information:        Orders:  [    ] CBC                                            [     ] CT Head no contrast  [    ] BMP                                           [     ] CT Abdomen/Pelvis no contrast  [    ] PT/INR                                       [     ] CT Abdomen/Pelvis IV contrast, w/ oral contrast  [    ] Troponin                                   [     ] CT Abdomen/Pelvis IV contrast, no oral contrast  [    ] BNP                                            [     ] See ordersheet  [    ] CXR                                             [     ] Other:__________________________  [    ] EKG

## 2019-11-09 NOTE — ED Notes (Signed)
 ED Patient Summary       ;       [facility] Emergency Department  [address], [place] [address]  [phone]  Discharge Instructions (Patient)  _______________________________________     Name: Gabrielle Olsen, Gabrielle Olsen  DOB: 02/26/1976                   MRN: [id number]                   FIN: [phone]  Reason For Visit: Chest pain; CP  Final Diagnosis: Muscle strain of anterior chest wall     Visit Date: 11/09/2019 11:45:00  Address: [address] [facility] [address]  Phone: [phone]     Emergency Department Providers:         Primary Physician:   RODGERS ILA LULLA Shelvy [facility] would like to thank you for allowing us  to assist you with your healthcare needs. The following includes patient education materials and information regarding your injury/illness.     Follow-up Instructions:  You were seen today on an emergency basis. Please contact your primary care doctor for a follow up appointment. If you received a referral to a specialist doctor, it is important you follow-up as instructed.    It is important that you call your follow-up doctor to schedule and confirm the location of your next appointment. Your doctor may practice at multiple locations. The office location of your follow-up appointment may be different to the one written on your discharge instructions.    If you do not have a primary care doctor, please call [phone] for help in finding a Florie Cassis. [facility] Provider. For help in finding a specialist doctor, please call [phone].    The [facility] "Ask a Nurse" line in staffed by Registered Nurses and is a free service to the community. We are available Monday - Friday from 8am to 5pm to answer your questions about your health. Please call [phone].    If your condition gets worse before your follow-up with your primary care doctor or specialist, please return to the Emergency Department.        Follow Up  Appointments:  Primary Care Provider:      Name: PCP,  NONE      Phone:                  With: Address: When:   Follow-up in Emergency Department  , only if needed   Comments:   Medications as prescribed for chest wall strain     Apply local moist heat to the area of discomfort     Bedrest for 48 hours     Follow-up to the emergency room otherwise as needed especially if there is any shortness of breath, fever or severe worsening symptoms              Printed Prescriptions:    Patient Education Materials:  Discharge Orders          Discharge Patient 11/09/19 15:09:00 EST         Comment:      Muscle Strain, Easy-to-Read     Muscle Strain    A muscle strain (pulled muscle) happens when a muscle is stretched beyond normal length. It happens when a sudden, violent force stretches your muscle too far. Usually, a few of the fibers in your muscle are torn. Muscle strain  is common in athletes. Recovery usually takes 1?2 weeks. Complete healing takes 5?6 weeks.       HOME CARE     Follow the PRICE method of treatment to help your injury get better. Do this the first 2?3 days after the injury:    ? Protect. Protect the muscle to keep it from getting injured again.    ? Rest. Limit your activity and rest the injured body part.    ? Ice. Put ice in a plastic bag. Place a towel between your skin and the bag. Then, apply the ice and leave it on from 15?20 minutes each hour. After the third day, switch to moist heat packs.     ? Compression. Use a splint or elastic bandage on the injured area for comfort. Do not put it on too tightly.    ? Elevate. Keep the injured body part above the level of your heart.      Only take medicine as told by your doctor.     Warm up before doing exercise to prevent future muscle strains.    GET HELP IF:     You have more pain or puffiness (swelling) in the injured area.     You feel numbness, tingling, or notice a loss of strength in the injured area.     MAKE SURE YOU:     Understand these  instructions.      Will watch your condition.     Will get help right away if you are not doing well or get worse.    This information is not intended to replace advice given to you by your health care provider. Make sure you discuss any questions you have with your health care provider.    Document Released: 09/17/2008 Document Revised: 09/29/2013 Document Reviewed: 07/08/2013  Elsevier Interactive Patient Education ?2016 Elsevier Inc.         Allergy Info: codeine     Medication Information:  [facility] provided you with a complete list of medications post discharge, if you have been instructed to stop taking a medication please ensure you also follow up with this information to your Primary Care Physician.  Unless otherwise noted, patient will continue to take medications as prescribed prior to the Emergency Room visit.  Any specific questions regarding your chronic medications and dosages should be discussed with your physician(s) and pharmacist.          cyclobenzaprine (Flexeril 10 mg oral tablet) 1 Tabs Oral (given by mouth) 3 times a day as needed spasm for 10 Days. Refills: 0.  ibuprofen  (ibuprofen  800 mg oral tablet) 1 Tabs Oral (given by mouth) 3 times a day as needed moderate pain (4-7) for 7 Days. Refills: 0.      Medications Administered During Visit:              Medication Dose Route   ibuprofen  800 mg Oral   HYDROcodone-acetaminophen  2 tabs Oral          Major Tests and Procedures:  The following procedures and tests were performed during your ED visit.  COMMON PROCEDURES%>  COMMON PROCEDURES COMMENTS%>          Laboratory Orders  No laboratory orders were placed.              Radiology Orders  Name Status Details   XR Chest 1 View Portable Completed 11/09/19 12:23:00 EST, STAT 1 hour or less, Reason: Chest pain, Transport Mode: Portable, pp_set_radiology_subspecialty  Patient Care Orders  Name Status Details   Discharge Patient Ordered 11/09/19 15:09:00 EST   ED  Assessment Adult Ordered 11/09/19 11:55:44 EST, 11/09/19 11:55:44 EST   ED Secondary Triage Ordered 11/09/19 11:55:44 EST, 11/09/19 11:55:44 EST   ED Triage Adult Completed 11/09/19 11:45:21 EST, 11/09/19 11:45:21 EST       ---------------------------------------------------------------------------------------------------------------------  [facility]) encourages you to self-enroll in the [facility] Patient Portal.  [facility] Patient Portal will allow you to manage your personal health information securely from your own electronic device now and in the future.  To begin your Patient Portal enrollment process, please visit [email or web address] Click on "Sign up now" under [facility].  If you find that you need additional assistance on the [facility] Patient Portal or need a copy of your medical records, please call the [facility] Medical Records Office at [phone].  Comment:

## 2019-11-09 NOTE — ED Notes (Signed)
 ED Patient Education Note     Patient Education Materials Follows:  Musculoskeletal     Muscle Strain    A muscle strain (pulled muscle) happens when a muscle is stretched beyond normal length. It happens when a sudden, violent force stretches your muscle too far. Usually, a few of the fibers in your muscle are torn. Muscle strain is common in athletes. Recovery usually takes 1?2 weeks. Complete healing takes 5?6 weeks.       HOME CARE     Follow the PRICE method of treatment to help your injury get better. Do this the first 2?3 days after the injury:    ? Protect. Protect the muscle to keep it from getting injured again.    ? Rest. Limit your activity and rest the injured body part.    ? Ice. Put ice in a plastic bag. Place a towel between your skin and the bag. Then, apply the ice and leave it on from 15?20 minutes each hour. After the third day, switch to moist heat packs.     ? Compression. Use a splint or elastic bandage on the injured area for comfort. Do not put it on too tightly.    ? Elevate. Keep the injured body part above the level of your heart.      Only take medicine as told by your doctor.     Warm up before doing exercise to prevent future muscle strains.    GET HELP IF:     You have more pain or puffiness (swelling) in the injured area.     You feel numbness, tingling, or notice a loss of strength in the injured area.     MAKE SURE YOU:     Understand these instructions.      Will watch your condition.     Will get help right away if you are not doing well or get worse.    This information is not intended to replace advice given to you by your health care provider. Make sure you discuss any questions you have with your health care provider.    Document Released: 09/17/2008 Document Revised: 09/29/2013 Document Reviewed: 07/08/2013  Elsevier Interactive Patient Education ?2016 Elsevier Inc.

## 2021-06-05 LAB — COVID-19: SARS-CoV-2: NOT DETECTED

## 2021-06-05 NOTE — Discharge Summary (Signed)
 ED Clinical Summary                     [facility]  [address]  [place], [place], [address]  [phone]          PERSON INFORMATION  Name: DANAI, GOTTO Age:  45 Years DOB: [birth year]   Sex: Female Language: English PCP: PCP,  NONE   Marital Status: Married Phone: [phone] Med Service: JENENE DELLIE Sabot   MRN: [id number] Acct# [id number] Arrival: 06/05/2021 10:24:00   Visit Reason: Ear pain; EAR ACHE/CHEST TIGHTNESS Acuity: 4 LOS: 000 02:45   Address:    [address] [place] [address]   Diagnosis:    Cough; Sinusitis  Medications:          New Medications  Printed Prescriptions  amoxicillin-clavulanate (Augmentin 875 mg-125 mg oral tablet) 1 Tabs Oral (given by mouth) 2 times a day for 10 Days. Refills: 0.  Last Dose:____________________      Medications Administered During Visit:                Medication Dose Route   ibuprofen  800 mg Oral   amoxicillin-clavulanate 1 tabs Oral               Allergies      codeine (Anaphylactic reaction)      Major Tests and Procedures:  The following procedures and tests were performed during your ED visit.  COMMON PROCEDURES%>  COMMON PROCEDURES COMMENTS%>                PROVIDER INFORMATION               Provider Role Assigned Sampson HIGHMAN, RN, [place] ED Nurse 06/05/2021 11:12:19    NEYSA SPANNER M-DO ED Provider 06/05/2021 11:54:40    Graylin Pean ED Nurse 06/05/2021 13:02:32        Attending Physician:  MADISON BOUCHARD K-FNP      Admit Doc  HUFF,  MEGAN K-FNP     Consulting Doc       VITALS INFORMATION  Vital Sign Triage Latest   Temp Oral ORAL_1%> ORAL%>   Temp Temporal TEMPORAL_1%> TEMPORAL%>   Temp Intravascular INTRAVASCULAR_1%> INTRAVASCULAR%>   Temp Axillary AXILLARY_1%> AXILLARY%>   Temp Rectal RECTAL_1%> RECTAL%>   02 Sat 100 % 100 %   Respiratory Rate RATE_1%> RATE%>   Peripheral Pulse Rate PULSE RATE_1%> PULSE RATE%>   Apical Heart Rate HEART RATE_1%> HEART RATE%>   Blood Pressure BLOOD PRESSURE_1%>/ BLOOD  PRESSURE_1%>95 mmHg BLOOD PRESSURE%> / BLOOD PRESSURE%>107 mmHg                 Immunizations      No Immunizations Documented This Visit          DISCHARGE INFORMATION   Discharge Disposition: H Outpt-Sent Home   Discharge Location:  Home   Discharge Date and Time:  06/05/2021 13:09:58   ED Checkout Date and Time:  06/05/2021 13:09:58     DEPART REASON INCOMPLETE INFORMATION               Depart Action Incomplete Reason   Interactive View/I&O Recently assessed               Problems      Active           No Chronic Problems              Smoking Status  No Smoking Status Documented         PATIENT EDUCATION INFORMATION  Instructions:     Sinusitis, Adult     Follow up:                   With: Address: When:   [facility] [phone] Within 1 week   Comments:   Call to establish a primary care physician if you don not have one       With: Address: When:   Follow up with primary care provider  Within 1 week   Comments:   Return to ED if symptoms worsen              ED PROVIDER DOCUMENTATION     Patient:   MCKINZY, FULLER             MRN: [id number]            FIN: [id number]               Age:   66 years     Sex:  Female     DOB:  [birth year]   Associated Diagnoses:   Sinusitis; Cough   Author:   NEYSA SPANNER M-DO      Basic Information   Time seen: Provider Seen (ST)   ED Provider/Time:    NEYSA SPANNER M-DO / 06/05/2021 11:54  .   History source: Patient.   Arrival mode: Private vehicle.   History limitation: None.   Additional information: Chief Complaint from Nursing Triage Note   Chief Complaint  Chief Complaint: pt c/o summer cold, left ear pain and barking cough, states when she coughs it causes a pain in her chest. Denies chest pain - states only when she coughs she has the pain. (06/05/21 10:40:00).      History of Present Illness   45 year old otherwise healthy female presents to the emergency department complaining of 2 weeks of sinus pressure, congestion, barking cough, ear pressure.  She  is having increased pain in her left ear.  Her coworkers wanted her to come because of her cough.  She gets pain across her chest from the fall.  Time she laughs and makes her cough.  She is tried taking Xyzal, Flonase, and Vicks without significant relief.  She is vaccinated for COVID-19 and has been previously infected.  Denies fevers and chills but admits to being hot and cold.      Review of Systems   Constitutional symptoms:  Negative except as documented in HPI.   Skin symptoms:  No rash,    Eye symptoms:  Vision unchanged.   ENMT symptoms:  Negative except as documented in HPI.   Respiratory symptoms:  Negative except as documented in HPI.   Cardiovascular symptoms:  No chest pain, no palpitations.    Gastrointestinal symptoms:  No abdominal pain, no nausea, no vomiting, no diarrhea, no rectal bleeding.    Genitourinary symptoms:  No dysuria, no hematuria.    Musculoskeletal symptoms:  No back pain,    Neurologic symptoms:  Headache.             Additional review of systems information: All other systems reviewed and otherwise negative.      Health Status   Allergies:    Allergic Reactions (Selected)  Severe  Codeine- Anaphylactic reaction..   Medications:  (Selected)   Inpatient Medications  Ordered  Motrin : 800 mg, 1 tabs, Oral, Once.      Past Medical/  Family/ Social History   Medical history: Reviewed as documented in chart.   Surgical history: Reviewed as documented in chart.   Family history: Not significant.   Social history: Reviewed as documented in chart.   Problem list:    Active Problems (1)  No Chronic Problems   , per nurse's notes.      Physical Examination               Vital Signs   Vital Signs   06/05/2021 11:09 EDT Respiratory Rate 16 br/min   06/05/2021 10:40 EDT Systolic Blood Pressure 160 mmHg  HI    Diastolic Blood Pressure 95 mmHg  HI    Temperature Oral 36.9 degC    Heart Rate Monitored 94 bpm    Respiratory Rate 18 br/min    SpO2 100 %   .   Measurements   06/05/2021 10:42 EDT Body  Mass Index est meas 41.59 kg/m2    Body Mass Index Measured 41.59 kg/m2   06/05/2021 10:40 EDT Height/Length Measured 167 cm    Weight Dosing 116 kg   .   Basic Oxygen Information   06/05/2021 11:09 EDT Oxygen Therapy Room air   06/05/2021 10:40 EDT Oxygen Therapy Room air    SpO2 100 %   .   General:  Alert, no acute distress.    Skin:  Warm, dry, intact.    Head:  Normocephalic, atraumatic.    Neck:  Supple, trachea midline, Lymphadenopathy: Bilateral, anterior, mild, tender.    Eye:  Extraocular movements are intact, normal conjunctiva.    Ears, nose, mouth and throat:  Oral mucosa moist, no pharyngeal erythema or exudate, Tympanic membrane: Bilateral, mild, bulging, Nose: Bilateral nares, mild, congestion, swelling.    Cardiovascular:  Regular rate and rhythm, Normal peripheral perfusion.    Respiratory:  Lungs are clear to auscultation, respirations are non-labored.    Chest wall:  No tenderness.   Back:  Normal range of motion.   Musculoskeletal:  Normal ROM, no deformity.    Gastrointestinal:  Soft, Nontender, Non distended.    Neurological:  Alert and oriented to person, place, time, and situation, No focal neurological deficit observed.       Medical Decision Making   Rationale:  Chest x-ray unremarkable.  Patient negative for COVID.  Patient symptoms have been going on for more than 2 weeks so we will plan to start treatment for possible bacterial sinusitis.  Will recommend continued symptomatic treatment at home.   Documents reviewed:  Emergency department nurses' notes.   Results review:  Lab results : Lab View   06/05/2021 12:08 EDT      COVID (SARS-CoV-2) Only ([place])            Not Detected  .   Radiology results:  Rad Results (ST)   XR Chest 1 View Portable  ?  06/05/21 12:21:32  PORTABLE AP VIEW OF THE CHEST    DATE: 06/05/21.    INDICATION:Other abnormalities of breathing.    COMPARISON: 11/09/2019    NUMBER OF RADIOGRAPHIC IMAGES: 1    FINDINGS:    Heart and mediastinal contours are within normal  limits.  The lungs are clear.  No evidence of acute osseous abnormality.    IMPRESSION: There is no evidence of acute cardiopulmonary disease.  ?  Signed By: LENON PRENTICE BROOM  .      Impression and Plan   Diagnosis   Sinusitis (ICD10-CM J32.9, Discharge, Medical)   Cough (ICD10-CM R05.9, Discharge, Medical)  Plan   Condition: Stable.    Disposition: Discharged: Time  06/05/2021 12:57:00, to home.    Prescriptions: Launch prescriptions   Pharmacy:  Augmentin 875 mg-125 mg oral tablet (Prescribe): 1 tabs, Oral, BID, for 10 days, 20 tabs, 0 Refill(s).    Patient was given the following educational materials: Sinusitis, Adult.    Follow up with: Follow up with primary care provider Within 1 week Return to ED if symptoms worsen; [facility] Within 1 week Call to establish a primary care physician if you don not have one.    Counseled: Patient, Regarding diagnosis, Regarding diagnostic results, Regarding treatment plan, Regarding prescription, Patient indicated understanding of instructions.

## 2021-06-05 NOTE — ED Notes (Signed)
ED Patient Education Note     Patient Education Materials Follows:  Infectious Disease     Sinusitis, Adult      Sinusitis is inflammation of your sinuses. Sinuses are hollow spaces in the bones around your face. Your sinuses are located:   Around your eyes.     In the middle of your forehead.     Behind your nose.     In your cheekbones.      Mucus normally drains out of your sinuses. When your nasal tissues become inflamed or swollen, mucus can become trapped or blocked. This allows bacteria, viruses, and fungi to grow, which leads to infection. Most infections of the sinuses are caused by a virus.    Sinusitis can develop quickly. It can last for up to 4 weeks (acute) or for more than 12 weeks (chronic). Sinusitis often develops after a cold.      What are the causes?    This condition is caused by anything that creates swelling in the sinuses or stops mucus from draining. This includes:   Allergies.     Asthma.     Infection from bacteria or viruses.     Deformities or blockages in your nose or sinuses.     Abnormal growths in the nose (nasal polyps).     Pollutants, such as chemicals or irritants in the air.     Infection from fungi (rare).        What increases the risk?    You are more likely to develop this condition if you:   Have a weak body defense system (immune system).     Do a lot of swimming or diving.     Overuse nasal sprays.     Smoke.        What are the signs or symptoms?    The main symptoms of this condition are pain and a feeling of pressure around the affected sinuses. Other symptoms include:   Stuffy nose or congestion.     Thick drainage from your nose.     Swelling and warmth over the affected sinuses.     Headache.     Upper toothache.     A cough that may get worse at night.     Extra mucus that collects in the throat or the back of the nose (postnasal drip).     Decreased sense of smell and taste.     Fatigue.     A fever.     Sore throat.     Bad breath.        How is this  diagnosed?    This condition is diagnosed based on:   Your symptoms.     Your medical history.     A physical exam.     Tests to find out if your condition is acute or chronic. This may include:  ? Checking your nose for nasal polyps.    ? Viewing your sinuses using a device that has a light (endoscope).    ? Testing for allergies or bacteria.    ? Imaging tests, such as an MRI or CT scan.        In rare cases, a bone biopsy may be done to rule out more serious types of fungal sinus disease.      How is this treated?    Treatment for sinusitis depends on the cause and whether your condition is chronic or acute.   If caused by a virus,   your symptoms should go away on their own within 10 days. You may be given medicines to relieve symptoms. They include:  ? Medicines that shrink swollen nasal passages (topical intranasal decongestants).     ? Medicines that treat allergies (antihistamines).    ? A spray that eases inflammation of the nostrils (topical intranasal corticosteroids).    ? Rinses that help get rid of thick mucus in your nose (nasal saline washes).       If caused by bacteria, your health care provider may recommend waiting to see if your symptoms improve. Most bacterial infections will get better without antibiotic medicine. You may be given antibiotics if you have:  ? A severe infection.     ? A weak immune system.       If caused by narrow nasal passages or nasal polyps, you may need to have surgery.        Follow these instructions at home:    Medicines     Take, use, or apply over-the-counter and prescription medicines only as told by your health care provider. These may include nasal sprays.     If you were prescribed an antibiotic medicine, take it as told by your health care provider. Do not stop taking the antibiotic even if you start to feel better.      Hydrate and humidify       Drink enough fluid to keep your urine pale yellow. Staying hydrated will help to thin your mucus.     Use a cool mist  humidifier to keep the humidity level in your home above 50%.     Inhale steam for 10?15 minutes, 3?4 times a day, or as told by your health care provider. You can do this in the bathroom while a hot shower is running.     Limit your exposure to cool or dry air.      Rest     Rest as much as possible.     Sleep with your head raised (elevated).     Make sure you get enough sleep each night.      General instructions       Apply a warm, moist washcloth to your face 3?4 times a day or as told by your health care provider. This will help with discomfort.     Wash your hands often with soap and water to reduce your exposure to germs. If soap and water are not available, use hand sanitizer.     Do not smoke. Avoid being around people who are smoking (secondhand smoke).     Keep all follow-up visits as told by your health care provider. This is important.        Contact a health care provider if:     You have a fever.     Your symptoms get worse.     Your symptoms do not improve within 10 days.      Get help right away if:     You have a severe headache.     You have persistent vomiting.     You have severe pain or swelling around your face or eyes.     You have vision problems.     You develop confusion.     Your neck is stiff.     You have trouble breathing.      Summary     Sinusitis is soreness and inflammation of your sinuses. Sinuses are hollow spaces in the bones around your face.       This condition is caused by nasal tissues that become inflamed or swollen. The swelling traps or blocks the flow of mucus. This allows bacteria, viruses, and fungi to grow, which leads to infection.     If you were prescribed an antibiotic medicine, take it as told by your health care provider. Do not stop taking the antibiotic even if you start to feel better.     Keep all follow-up visits as told by your health care provider. This is important.      This information is not intended to replace advice given to you by your health care  provider. Make sure you discuss any questions you have with your health care provider.      Document Revised: 05/11/2018 Document Reviewed: 05/11/2018  Elsevier Patient Education ? 2021 Elsevier Inc.

## 2021-06-05 NOTE — ED Notes (Signed)
 ED Triage Note       ED Triage Adult Entered On:  06/05/2021 10:42 EDT    Performed On:  06/05/2021 10:40 EDT by LENNY, RN, KRISTY M               Triage   Numeric Rating Pain Scale :   7   Chief Complaint :   pt c/o summer cold, left ear pain and barking cough, states when she coughs it causes a pain in her chest. Denies chest pain - states only when she coughs she has the pain.    [place] Mode of Arrival :   Private vehicle   Infectious Disease Documentation :   Document assessment   Temperature Oral :   36.9 degC(Converted to: 98.4 degF)    Heart Rate Monitored :   94 bpm   Respiratory Rate :   18 br/min   Systolic Blood Pressure :   160 mmHg (HI)    Diastolic Blood Pressure :   95 mmHg (HI)    SpO2 :   100 %   Oxygen Therapy :   Room air   Patient presentation :   None of the above   Chief Complaint or Presentation suggest infection :   No   Weight Dosing :   116 kg(Converted to: 255 lb 12 oz)    Height :   167 cm(Converted to: 5 ft 6 in)    Body Mass Index Dosing :   42 kg/m2   BELLEW, RN, KRISTY M - 06/05/2021 10:40 EDT   DCP GENERIC CODE   Tracking Acuity :   4   Tracking Group :   ED [id number] [employer]   [place], RN, TULLY HERO - 06/05/2021 10:40 EDT   ED General Section :   Document assessment   Pregnancy Status :   Patient denies   ED Allergies Section :   Document assessment   ED Reason for Visit Section :   Document assessment   ED Home Meds Section :   Document assessment   BELLEW, RN, KRISTY M - 06/05/2021 10:40 EDT   ID Risk Screen Symptoms   Recent Travel History :   No recent travel   TB Symptom Screen :   No symptoms   Last 90 days COVID-19 ID :   No   BELLEW, RN, KRISTY M - 06/05/2021 10:40 EDT   Allergies   (As Of: 06/05/2021 10:42:08 EDT)   Allergies (Active)   codeine  Estimated Onset Date:   Unspecified ; Reactions:   Anaphylactic reaction ; Created By:   Verlie, RN, Ileana L; Reaction Status:   Active ; Category:   Drug ; Substance:   codeine ; Type:   Allergy ; Severity:   Severe ; Updated By:    Verlie OBIE Ileana LITTIE; Reviewed Date:   06/05/2021 10:41 EDT        Psycho-Social   Last 3 mo, thoughts killing self/others :   Patient denies   Right click within box for Suspected Abuse policy link. :   None   Feels Safe Where Live :   Yes   ED Behavioral Activity Rating Scale :   4 - Quiet and awake (normal level of activity)   BELLEW, RN, TULLY HERO - 06/05/2021 10:40 EDT   ED Home Med List   Medication List   (As Of: 06/05/2021 10:42:08 EDT)   No Known Home Medications     BELLEW, RN, KRISTY M - 06/05/2021  10:42:06           ED Reason for Visit   (As Of: 06/05/2021 10:42:08 EDT)   Problems(Active)    No Chronic Problems ([facility]  :NKP )  Name of Problem:   No Chronic Problems ; Recorder:   LENNY, RN, TULLY HERO; Code:   NKP ; Last Updated:   06/05/2021 10:41 EDT ; Life Cycle Date:   06/05/2021 ; Life Cycle Status:   Active ; Vocabulary:   [facility]          Diagnoses(Active)    Ear pain  Date:   06/05/2021 ; Diagnosis Type:   Reason For Visit ; Confirmation:   Complaint of ; Clinical Dx:   Ear pain ; Classification:   Medical ; Clinical Service:   Emergency medicine ; Code:   PNED ; Probability:   0 ; Diagnosis Code:   [id number]

## 2021-06-05 NOTE — ED Notes (Signed)
 ED Patient Summary       ;       [facility] Emergency Department  [address] [place] [address]  [phone]  Discharge Instructions (Patient)  Name: Gabrielle Olsen, Gabrielle Olsen  DOB: 1976/03/06                   MRN: [id number]                   FIN: [id number]  Reason For Visit: Ear pain; EAR ACHE/CHEST TIGHTNESS  Final Diagnosis: Cough; Sinusitis     Visit Date: 06/05/2021 10:24:00  Address: [address] [place] [address]  Phone: [phone]     Emergency Department Providers:         Primary Physician:      NEYSA ANDREZ CHRISTELLA [facility] would like to thank you for allowing us  to assist you with your healthcare needs. The following includes patient education materials and information regarding your injury/illness.     Follow-up Instructions:  You were seen today on an emergency basis. Please contact your primary care doctor for a follow up appointment. If you received a referral to a specialist doctor, it is important you follow-up as instructed.    It is important that you call your follow-up doctor to schedule and confirm the location of your next appointment. Your doctor may practice at multiple locations. The office location of your follow-up appointment may be different to the one written on your discharge instructions.    If you do not have a primary care doctor, please call [phone] for help in finding a [facility] Provider. For help in finding a specialist doctor, please call [phone].    If your condition gets worse before your follow-up with your primary care doctor or specialist, please return to the Emergency Department.      Coronavirus 2019 (COVID-19) Reminders:     Patients age 59 - 80, with parental consent, and patients over age 66 can make an appointment for a COVID-19 vaccine. Patients can contact their [facility] doctors' offices to schedule an appointment to receive the COVID-19 vaccine. Patients who do not  have a [facility] physician can call [phone] to schedule vaccination appointments.      Follow Up Appointments:  Primary Care Provider:     Name: PCP,  NONE     Phone:                  With: Address: When:   [facility] [phone] Within 1 week   Comments:   Call to establish a primary care physician if you don not have one       With: Address: When:   Follow up with primary care provider  Within 1 week   Comments:   Return to ED if symptoms worsen              [name] SERVICES%>          New Medications  Printed Prescriptions  amoxicillin-clavulanate (Augmentin 875 mg-125 mg oral tablet) 1 Tabs Oral (given by mouth) 2 times a day for 10 Days. Refills: 0.  Last Dose:____________________      Allergy Info: codeine     Discharge Additional Information          Discharge Patient 06/05/21 12:58:00 EDT      Patient Education Materials:  Sinusitis, Adult      Sinusitis is inflammation of your sinuses. Sinuses are hollow spaces in the bones around your face. Your sinuses are located:   Around your eyes.     In the middle of your forehead.     Behind your nose.     In your cheekbones.      Mucus normally drains out of your sinuses. When your nasal tissues become inflamed or swollen, mucus can become trapped or blocked. This allows bacteria, viruses, and fungi to grow, which leads to infection. Most infections of the sinuses are caused by a virus.    Sinusitis can develop quickly. It can last for up to 4 weeks (acute) or for more than 12 weeks (chronic). Sinusitis often develops after a cold.      What are the causes?    This condition is caused by anything that creates swelling in the sinuses or stops mucus from draining. This includes:   Allergies.     Asthma.     Infection from bacteria or viruses.     Deformities or blockages in your nose or sinuses.     Abnormal growths in the nose (nasal polyps).     Pollutants, such as chemicals or irritants in the air.      Infection from fungi (rare).        What increases the risk?    You are more likely to develop this condition if you:   Have a weak body defense system (immune system).     Do a lot of swimming or diving.     Overuse nasal sprays.     Smoke.        What are the signs or symptoms?    The main symptoms of this condition are pain and a feeling of pressure around the affected sinuses. Other symptoms include:   Stuffy nose or congestion.     Thick drainage from your nose.     Swelling and warmth over the affected sinuses.     Headache.     Upper toothache.     A cough that may get worse at night.     Extra mucus that collects in the throat or the back of the nose (postnasal drip).     Decreased sense of smell and taste.     Fatigue.     A fever.     Sore throat.     Bad breath.        How is this diagnosed?    This condition is diagnosed based on:   Your symptoms.     Your medical history.     A physical exam.     Tests to find out if your condition is acute or chronic. This may include:  ? Checking your nose for nasal polyps.    ? Viewing your sinuses using a device that has a light (endoscope).    ? Testing for allergies or bacteria.    ? Imaging tests, such as an MRI or CT scan.        In rare cases, a bone biopsy may be done to rule out more serious types of fungal sinus disease.      How is this treated?    Treatment for sinusitis depends on the cause and whether your condition is chronic or acute.   If caused by a virus, your symptoms should go away on their own within 10 days. You may be given medicines to relieve symptoms.  They include:  ? Medicines that shrink swollen nasal passages (topical intranasal decongestants).     ? Medicines that treat allergies (antihistamines).    ? A spray that eases inflammation of the nostrils (topical intranasal corticosteroids).    ? Rinses that help get rid of thick mucus in your nose (nasal saline washes).       If caused by bacteria, your health care provider may recommend  waiting to see if your symptoms improve. Most bacterial infections will get better without antibiotic medicine. You may be given antibiotics if you have:  ? A severe infection.     ? A weak immune system.       If caused by narrow nasal passages or nasal polyps, you may need to have surgery.        Follow these instructions at home:    Medicines     Take, use, or apply over-the-counter and prescription medicines only as told by your health care provider. These may include nasal sprays.     If you were prescribed an antibiotic medicine, take it as told by your health care provider. Do not stop taking the antibiotic even if you start to feel better.      Hydrate and humidify       Drink enough fluid to keep your urine pale yellow. Staying hydrated will help to thin your mucus.     Use a cool mist humidifier to keep the humidity level in your home above 50%.     Inhale steam for 10?15 minutes, 3?4 times a day, or as told by your health care provider. You can do this in the bathroom while a hot shower is running.     Limit your exposure to cool or dry air.      Rest     Rest as much as possible.     Sleep with your head raised (elevated).     Make sure you get enough sleep each night.      General instructions       Apply a warm, moist washcloth to your face 3?4 times a day or as told by your health care provider. This will help with discomfort.     Wash your hands often with soap and water to reduce your exposure to germs. If soap and water are not available, use hand sanitizer.     Do not smoke. Avoid being around people who are smoking (secondhand smoke).     Keep all follow-up visits as told by your health care provider. This is important.        Contact a health care provider if:     You have a fever.     Your symptoms get worse.     Your symptoms do not improve within 10 days.      Get help right away if:     You have a severe headache.     You have persistent vomiting.     You have severe pain or swelling around  your face or eyes.     You have vision problems.     You develop confusion.     Your neck is stiff.     You have trouble breathing.      Summary     Sinusitis is soreness and inflammation of your sinuses. Sinuses are hollow spaces in the bones around your face.     This condition is caused by nasal tissues that become inflamed or swollen. The swelling  traps or blocks the flow of mucus. This allows bacteria, viruses, and fungi to grow, which leads to infection.     If you were prescribed an antibiotic medicine, take it as told by your health care provider. Do not stop taking the antibiotic even if you start to feel better.     Keep all follow-up visits as told by your health care provider. This is important.      This information is not intended to replace advice given to you by your health care provider. Make sure you discuss any questions you have with your health care provider.      Document Revised: 05/11/2018 Document Reviewed: 05/11/2018  Elsevier Patient Education ? 2021 Elsevier Inc.      ---------------------------------------------------------------------------------------------------------------------  [facility] allows patients to review your COVID and other test results as well as discharge documents from any [facility], Emergency Department, surgical center or outpatient lab. Test results are typically available 36 hours after the test is completed.     [facility] encourages you to self-enroll in the [facility] Patient Portal.     To begin your self-enrollment process, please visit [email or web address] Under [facility], click on "Sign up now".     NOTE: You must be 16 years and older to use [facility] Self-Enroll online. If you are a parent, caregiver, or guardian; you need an invite to access your child's or dependent's health records. To obtain an invite, contact the Medical Records department at [phone] Monday through  Friday, 8-4:30, select option 3 . If we receive your call afterhours, we will return your call the next business day.     If you have issues trying to create or access your account, contact [facility] support at [phone] available 7 days a week 24 hours a day.     Comment:

## 2021-06-05 NOTE — ED Notes (Signed)
ED Triage Note       ED Secondary Triage Entered On:  06/05/2021 10:42 EDT    Performed On:  06/05/2021 10:42 EDT by Zadie Rhine, RN, KRISTY M               General Information   Barriers to Learning :   None evident   COVID-19 Vaccine Status :   2 Doses received   2 Doses Received Manufacturer :   Pfizer vaccine   ED Home Meds Section :   Document assessment   [facility] ED Fall Risk Section :   Document assessment   ED Advance Directives Section :   Document assessment   ED Palliative Screen :   N/A (prefilled for <65yo)   BELLEW, RN, KRISTY M - 06/05/2021 10:42 EDT   (As Of: 06/05/2021 10:42:27 EDT)   Problems(Active)    No Chronic Problems ([facility]  :NKP )  Name of Problem:   No Chronic Problems ; Recorder:   Zadie Rhine, RN, Soyla Dryer; Code:   NKP ; Last Updated:   06/05/2021 10:41 EDT ; Life Cycle Date:   06/05/2021 ; Life Cycle Status:   Active ; Vocabulary:   [facility]          Diagnoses(Active)    Ear pain  Date:   06/05/2021 ; Diagnosis Type:   Reason For Visit ; Confirmation:   Complaint of ; Clinical Dx:   Ear pain ; Classification:   Medical ; Clinical Service:   Emergency medicine ; Code:   PNED ; Probability:   0 ; Diagnosis Code:   [id number]             -    Procedure History   (As Of: 06/05/2021 10:42:27 EDT)     [facility] Fall Risk Assessment Tool   Hx of falling last 3 months ED Fall :   No   Patient confused or disoriented ED Fall :   No   Patient intoxicated or sedated ED Fall :   No   Patient impaired gait ED Fall :   No   Use a mobility assistance device ED Fall :   No   Patient altered elimination ED Fall :   No   [facility] ED Fall Score :   0    BELLEW, RN, Soyla Dryer - 06/05/2021 10:42 EDT   ED Advance Directive   Advance Directive :   No   BELLEW, RN, KRISTY M - 06/05/2021 10:42 EDT

## 2021-06-05 NOTE — ED Provider Notes (Signed)
ENT Problem *ED        Patient:   Gabrielle Olsen, Gabrielle Olsen             MRN: [id number]            FIN: [id number]               Age:   45 years     Sex:  Female     DOB:  1976-09-25   Associated Diagnoses:   Sinusitis; Cough   Author:   Gilman Buttner M-DO      Basic Information   Time seen: Provider Seen (ST)   ED Provider/Time:    Gilman Buttner M-DO / 06/05/2021 11:54  .   History source: Patient.   Arrival mode: Private vehicle.   History limitation: None.   Additional information: Chief Complaint from Nursing Triage Note   Chief Complaint  Chief Complaint: pt c/o "summer cold", left ear pain and barking cough, states when she coughs it causes a pain in her chest. Denies chest pain - states only when she coughs she has the pain. (06/05/21 10:40:00).      History of Present Illness   45 year old otherwise healthy female presents to the emergency department complaining of 2 weeks of sinus pressure, congestion, barking cough, ear pressure.  She is having increased pain in her left ear.  Her coworkers wanted her to come because of her cough.  She gets pain across her chest from the fall.  Time she laughs and makes her cough.  She is tried taking Xyzal, Flonase, and Vicks without significant relief.  She is vaccinated for COVID-19 and has been previously infected.  Denies fevers and chills but admits to being "hot and cold".      Review of Systems   Constitutional symptoms:  Negative except as documented in HPI.   Skin symptoms:  No rash,    Eye symptoms:  Vision unchanged.   ENMT symptoms:  Negative except as documented in HPI.   Respiratory symptoms:  Negative except as documented in HPI.   Cardiovascular symptoms:  No chest pain, no palpitations.    Gastrointestinal symptoms:  No abdominal pain, no nausea, no vomiting, no diarrhea, no rectal bleeding.    Genitourinary symptoms:  No dysuria, no hematuria.    Musculoskeletal symptoms:  No back pain,    Neurologic symptoms:  Headache.             Additional review of systems  information: All other systems reviewed and otherwise negative.      Health Status   Allergies:    Allergic Reactions (Selected)  Severe  Codeine- Anaphylactic reaction..   Medications:  (Selected)   Inpatient Medications  Ordered  Motrin: 800 mg, 1 tabs, Oral, Once.      Past Medical/ Family/ Social History   Medical history: Reviewed as documented in chart.   Surgical history: Reviewed as documented in chart.   Family history: Not significant.   Social history: Reviewed as documented in chart.   Problem list:    Active Problems (1)  No Chronic Problems   , per nurse's notes.      Physical Examination               Vital Signs   Vital Signs   06/05/2021 11:09 EDT Respiratory Rate 16 br/min   06/05/2021 10:40 EDT Systolic Blood Pressure 160 mmHg  HI    Diastolic Blood Pressure 95 mmHg  HI    Temperature Oral 36.9  degC    Heart Rate Monitored 94 bpm    Respiratory Rate 18 br/min    SpO2 100 %   .   Measurements   06/05/2021 10:42 EDT Body Mass Index est meas 41.59 kg/m2    Body Mass Index Measured 41.59 kg/m2   06/05/2021 10:40 EDT Height/Length Measured 167 cm    Weight Dosing 116 kg   .   Basic Oxygen Information   06/05/2021 11:09 EDT Oxygen Therapy Room air   06/05/2021 10:40 EDT Oxygen Therapy Room air    SpO2 100 %   .   General:  Alert, no acute distress.    Skin:  Warm, dry, intact.    Head:  Normocephalic, atraumatic.    Neck:  Supple, trachea midline, Lymphadenopathy: Bilateral, anterior, mild, tender.    Eye:  Extraocular movements are intact, normal conjunctiva.    Ears, nose, mouth and throat:  Oral mucosa moist, no pharyngeal erythema or exudate, Tympanic membrane: Bilateral, mild, bulging, Nose: Bilateral nares, mild, congestion, swelling.    Cardiovascular:  Regular rate and rhythm, Normal peripheral perfusion.    Respiratory:  Lungs are clear to auscultation, respirations are non-labored.    Chest wall:  No tenderness.   Back:  Normal range of motion.   Musculoskeletal:  Normal ROM, no deformity.     Gastrointestinal:  Soft, Nontender, Non distended.    Neurological:  Alert and oriented to person, place, time, and situation, No focal neurological deficit observed.       Medical Decision Making   Rationale:  Chest x-ray unremarkable.  Patient negative for COVID.  Patient symptoms have been going on for more than 2 weeks so we will plan to start treatment for possible bacterial sinusitis.  Will recommend continued symptomatic treatment at home.   Documents reviewed:  Emergency department nurses' notes.   Results review:  Lab results : Lab View   06/05/2021 12:08 EDT      COVID (SARS-CoV-2) Only ([place])            Not Detected  .   Radiology results:  Rad Results (ST)   XR Chest 1 View Portable  ?  06/05/21 12:21:32  PORTABLE AP VIEW OF THE CHEST    DATE: 06/05/21.    INDICATION:Other abnormalities of breathing.    COMPARISON: 11/09/2019    NUMBER OF RADIOGRAPHIC IMAGES: 1    FINDINGS:    Heart and mediastinal contours are within normal limits.  The lungs are clear.  No evidence of acute osseous abnormality.    IMPRESSION: There is no evidence of acute cardiopulmonary disease.  ?  Signed By: Leonides Grills  .      Impression and Plan   Diagnosis   Sinusitis (ICD10-CM J32.9, Discharge, Medical)   Cough (ICD10-CM R05.9, Discharge, Medical)   Plan   Condition: Stable.    Disposition: Discharged: Time  06/05/2021 12:57:00, to home.    Prescriptions: [facility] prescriptions   Pharmacy:  Augmentin 875 mg-125 mg oral tablet (Prescribe): 1 tabs, Oral, BID, for 10 days, 20 tabs, 0 Refill(s).    Patient was given the following educational materials: Sinusitis, Adult.    Follow up with: Follow up with primary care provider Within 1 week Return to ED if symptoms worsen; [facility] Within 1 week Call to establish a primary care physician if you don not have one.    Counseled: Patient, Regarding diagnosis, Regarding diagnostic results, Regarding treatment plan, Regarding prescription, Patient indicated  understanding of instructions.  Signature Line     Electronically Signed on 06/05/2021 01:07 PM EDT   ________________________________________________   Gilman Buttner M-DO               Modified by: Gilman Buttner M-DO on 06/05/2021 12:58 PM EDT

## 2022-06-17 ENCOUNTER — Emergency Department: Admit: 2022-06-17 | Payer: PRIVATE HEALTH INSURANCE | Primary: Diagnostic Radiology

## 2022-06-17 ENCOUNTER — Inpatient Hospital Stay
Admit: 2022-06-17 | Discharge: 2022-06-17 | Disposition: A | Discharge status: Home / Self Care | Payer: PRIVATE HEALTH INSURANCE | Attending: Emergency Medicine

## 2022-06-17 DIAGNOSIS — J069 Acute upper respiratory infection, unspecified: Secondary | ICD-10-CM

## 2022-06-17 LAB — COVID-19 & INFLUENZA COMBO (LIAT HOSPITAL)
INFLUENZA A: NOT DETECTED
INFLUENZA B: NOT DETECTED
SARS-CoV-2: NOT DETECTED

## 2022-06-17 MED ORDER — ACETAMINOPHEN 500 MG PO TABS
500 MG | Freq: Once | ORAL | Status: AC
Start: 2022-06-17 — End: 2022-06-17
  Administered 2022-06-17: 18:00:00 1000 mg via ORAL

## 2022-06-17 MED FILL — SM PAIN RELIEVER EX ST 500 MG PO TABS: 500 mg | ORAL | Qty: 2

## 2022-06-17 NOTE — ED Provider Notes (Signed)
[facility] EMERGENCY DEPT  EMERGENCY DEPARTMENT ENCOUNTER      Pt Name: Gabrielle Olsen  MRN: [id number]  Birthdate January 08, 1976  Date of evaluation: 06/17/2022  Provider: Hollace Kinnier, MD    CHIEF COMPLAINT       Chief Complaint   Patient presents with    Illness     Pt c/o tension headache with cough and congestion for a week         HISTORY OF PRESENT ILLNESS    46 year old female presents to the emergency room with 1 week of nasal congestion and cough.  No shortness breath.  No fever.  No sore throat.  No ear pain.  Patient also complains of a posterior headache.            Nursing Notes were reviewed.  REVIEW OF SYSTEMS     Review of Systems   Constitutional:  Negative for fever.   Respiratory:  Positive for cough. Negative for shortness of breath.      Except as noted above the remainder of the review of systems was reviewed and negative.     PAST MEDICAL HISTORY   No past medical history on file.    SURGICAL HISTORY     No past surgical history on file.    CURRENT MEDICATIONS       Previous Medications    No medications on file       ALLERGIES     Codeine    FAMILY HISTORY     No family history on file.     SOCIAL HISTORY       Social History     Socioeconomic History    Marital status: Married       SCREENINGS         Glasgow Coma Scale  Eye Opening: Spontaneous  Best Verbal Response: Oriented  Best Motor Response: Obeys commands  Glasgow Coma Scale Score: 15                     CIWA Assessment  BP: (!) 174/106  Pulse: 98                 PHYSICAL EXAM    (up to 7 for level 4, 8 or more for level 5)     ED Triage Vitals [06/17/22 1246]   BP Temp Temp Source Pulse Respirations SpO2 Height Weight - Scale   (!) 174/106 98 F (36.7 C) Oral 98 16 100 % 5' 6.5" (1.689 m) 267 lb (121.1 kg)       Physical Exam  Vitals and nursing note reviewed.   Constitutional:       Appearance: Normal appearance.   HENT:      Head: Normocephalic and atraumatic.      Right Ear: Tympanic membrane normal.      Left Ear: Tympanic membrane  normal.      Mouth/Throat:      Pharynx: No oropharyngeal exudate or posterior oropharyngeal erythema.   Neck:      Comments: No nuchal rigidity  Cardiovascular:      Rate and Rhythm: Normal rate and regular rhythm.   Pulmonary:      Effort: Pulmonary effort is normal.      Breath sounds: Normal breath sounds.   Musculoskeletal:         General: Normal range of motion.      Cervical back: Normal range of motion and neck supple.   Skin:     General:  Skin is warm and dry.   Neurological:      General: No focal deficit present.      Mental Status: She is alert.   Psychiatric:         Mood and Affect: Mood normal.         Behavior: Behavior normal.       PROCEDURES:  Unless otherwise noted below, none     Procedures    DIAGNOSTIC RESULTS   EKG: All EKG's are interpreted by the Emergency Department Physician who either signs or Co-signs this chart in the absence of a cardiologist.      RADIOLOGY:   Non-plain film images such as CT, Ultrasound and MRI are read by the radiologist. Plain radiographic images are visualized and preliminarily interpreted by the emergency physician with the below findings:    Interpretation per the Radiologist below, if available at the time of this note:    XR CHEST 1 VIEW   Final Result   No radiographic evidence of acute cardiopulmonary process.              ED BEDSIDE ULTRASOUND:   Performed by ED Physician - none    LABS:  Labs Reviewed   COVID-19 & INFLUENZA COMBO [facility])    Narrative:     Is this test for diagnosis or screening?->Diagnosis of ill patient  Symptomatic for COVID-19 as defined by CDC?->Yes  Date of Symptom Onset->06/16/22  Hospitalized for COVID-19?->No  Admitted to ICU for COVID-19?->No  Pregnant?->No  Previously tested for COVID-19?->Yes       All other labs were within normal range or not returned as of this dictation.  EMERGENCY DEPARTMENT COURSE/REASSESSMENT and MDM:   Vitals:    Vitals:    06/17/22 1246   BP: (!) 174/106   Pulse: 98   Resp: 16   Temp: 98 F  (36.7 C)   TempSrc: Oral   SpO2: 100%   Weight: 121.1 kg   Height: 5' 6.5" (1.689 m)       ED Course:       MDM  Number of Diagnoses or Management Options  Upper respiratory tract infection, unspecified type  Diagnosis management comments: 46 year old female presents with likely an upper respiratory infection.  Patient is overall well-appearing.  Patient's COVID/flu swab was negative.  Patient given Tylenol for her likely benign headache.  No fever.  No nuchal rigidity.  Chest x-ray is unremarkable.  We will discharge and encourage PCP follow-up.        CONSULTS:  None    FINAL IMPRESSION      1. Upper respiratory tract infection, unspecified type          DISPOSITION/PLAN   DISPOSITION        PATIENT REFERRED TO:  Provider Unknown, AGPCNP    In 2 days        DISCHARGE MEDICATIONS:  New Prescriptions    No medications on file     Controlled Substances Monitoring:     No flowsheet data found.    (Please note that portions of this note were completed with a voice recognition program.  Efforts were made to edit the dictations but occasionally words are mis-transcribed.)    Hollace Kinnier, MD (electronically signed)  Attending Emergency Physician           Hollace Kinnier, MD  06/17/22 [phone]

## 2022-06-17 NOTE — Discharge Instructions (Signed)
Please follow-up with your primary care physician in 2 to 3 days.  Please return to the emergency department should your symptoms persist or worsen, develop fever, difficulty breathing, and/or develop any other concerns.

## 2023-08-02 ENCOUNTER — Emergency Department: Admit: 2023-08-02 | Payer: PRIVATE HEALTH INSURANCE | Primary: Diagnostic Radiology

## 2023-08-02 ENCOUNTER — Inpatient Hospital Stay
Admit: 2023-08-02 | Discharge: 2023-08-02 | Disposition: A | Discharge status: Home / Self Care | Payer: MEDICAID | Attending: Emergency Medicine

## 2023-08-02 DIAGNOSIS — J069 Acute upper respiratory infection, unspecified: Secondary | ICD-10-CM

## 2023-08-02 LAB — EKG 12-LEAD
P Axis: 33 degrees
P-R Interval: 182 ms
Q-T Interval: 360 ms
QRS Duration: 76 ms
QTc Calculation (Bazett): 402 ms
R Axis: 54 degrees
T Axis: 52 degrees
Ventricular Rate: 85 {beats}/min

## 2023-08-02 LAB — COVID-19 & INFLUENZA COMBO (LIAT HOSPITAL)
Influenza A: NOT DETECTED
Influenza B: NOT DETECTED
SARS-CoV-2: NOT DETECTED

## 2023-08-02 MED ORDER — PREDNISONE 20 MG PO TABS
20 | Freq: Once | ORAL | Status: AC
Start: 2023-08-02 — End: 2023-08-02
  Administered 2023-08-02: 14:00:00 40 mg via ORAL

## 2023-08-02 MED ORDER — BENZONATATE 100 MG PO CAPS
100 | ORAL_CAPSULE | Freq: Three times a day (TID) | ORAL | 0 refills | Status: AC | PRN
Start: 2023-08-02 — End: 2023-08-07

## 2023-08-02 MED ORDER — GUAIFENESIN ER 600 MG PO TB12
600 | ORAL_TABLET | Freq: Two times a day (BID) | ORAL | 0 refills | Status: AC
Start: 2023-08-02 — End: 2023-08-12

## 2023-08-02 MED ORDER — BENZONATATE 100 MG PO CAPS
100 | Freq: Once | ORAL | Status: AC
Start: 2023-08-02 — End: 2023-08-02
  Administered 2023-08-02: 14:00:00 200 mg via ORAL

## 2023-08-02 MED ORDER — IBUPROFEN 400 MG PO TABS
400 | Freq: Once | ORAL | Status: AC
Start: 2023-08-02 — End: 2023-08-02
  Administered 2023-08-02: 14:00:00 400 mg via ORAL

## 2023-08-02 MED ORDER — PREDNISONE 20 MG PO TABS
20 | ORAL_TABLET | Freq: Every day | ORAL | 0 refills | Status: AC
Start: 2023-08-02 — End: 2023-08-06

## 2023-08-02 MED ORDER — ALBUTEROL SULFATE HFA 108 (90 BASE) MCG/ACT IN AERS
108 | Freq: Four times a day (QID) | RESPIRATORY_TRACT | 0 refills | Status: AC | PRN
Start: 2023-08-02 — End: ?

## 2023-08-02 MED ORDER — IPRATROPIUM-ALBUTEROL 0.5-2.5 (3) MG/3ML IN SOLN
Freq: Once | RESPIRATORY_TRACT | Status: AC
Start: 2023-08-02 — End: 2023-08-02
  Administered 2023-08-02: 14:00:00 1 via RESPIRATORY_TRACT

## 2023-08-02 MED FILL — IPRATROPIUM-ALBUTEROL 0.5-2.5 (3) MG/3ML IN SOLN: RESPIRATORY_TRACT | Qty: 3

## 2023-08-02 MED FILL — IBUPROFEN 400 MG PO TABS: 400 MG | ORAL | Qty: 1

## 2023-08-02 MED FILL — PREDNISONE 20 MG PO TABS: 20 MG | ORAL | Qty: 2

## 2023-08-02 MED FILL — BENZONATATE 100 MG PO CAPS: 100 MG | ORAL | Qty: 2

## 2023-08-02 NOTE — ED Provider Notes (Signed)
[facility] EMERGENCY DEPT  EMERGENCY DEPARTMENT ENCOUNTER      Pt Name: Gabrielle Olsen  MRN: [id number]  Birthdate 1976-09-04  Date of evaluation: 08/02/2023  Provider: Lucien Mons, MD  Provider evaluation time: 08/02/23 [phone]    CHIEF COMPLAINT       Chief Complaint   Patient presents with    Chest Pain    Shortness of Breath     Pt states she has been short of breath with chest pain x1 week and now has a cough. States the pain and sob is getting worse. A&Ox4         HISTORY OF PRESENT ILLNESS    Gabrielle Olsen is a 47 y.o. female who presents to the emergency department    Patient complains of 1 week of a cough productive of yellowish sputum without hemoptysis, along with chest discomfort and shortness of breath.  She notes that her son has been coughing recently as well.  She works at [employer] and has been exposed to a lot of people with COVID recently.  She has been short of breath walking recently.  Her chest hurts when she breathes and on palpation of left chest.  No abdominal symptoms.  No reported fever.  No leg swelling              Nursing Notes were reviewed.    REVIEW OF SYSTEMS       Review of Systems   All other systems reviewed and are negative.      Except as noted above the remainder of the review of systems was reviewed and negative.       PAST MEDICAL HISTORY   No past medical history on file.    SURGICAL HISTORY     No past surgical history on file.    CURRENT MEDICATIONS       Previous Medications    No medications on file       ALLERGIES     Codeine and Other    FAMILY HISTORY     No family history on file.       SOCIAL HISTORY       Social History     Socioeconomic History    Marital status: Married           PHYSICAL EXAM       ED Triage Vitals [08/02/23 0943]   BP Systolic BP Percentile Diastolic BP Percentile Temp Temp Source Pulse Respirations SpO2   (!) 181/109 -- -- 98.2 F (36.8 C) Oral 91 20 98 %      Height Weight - Scale         1.702 m (5\' 7" ) 127 kg (280 lb)             Physical  Exam  Vitals and nursing note reviewed.   Constitutional:       Appearance: Normal appearance.   HENT:      Head: Normocephalic and atraumatic.      Mouth/Throat:      Mouth: Mucous membranes are moist.   Eyes:      Extraocular Movements: Extraocular movements intact.      Conjunctiva/sclera: Conjunctivae normal.   Cardiovascular:      Rate and Rhythm: Normal rate and regular rhythm.      Heart sounds: No murmur heard.  Pulmonary:      Effort: Pulmonary effort is normal.      Breath sounds: Examination of the left-lower field reveals wheezing. Wheezing present. No rales.  Chest:      Chest wall: Tenderness present.      Comments: Reproducibly tender along the left upper chest wall soft tissue is no crepitus  Abdominal:      General: Abdomen is flat. Bowel sounds are normal.      Palpations: Abdomen is soft. There is no mass.   Musculoskeletal:         General: Normal range of motion.      Cervical back: Neck supple.      Right lower leg: No tenderness. No edema.      Left lower leg: No tenderness. No edema.   Skin:     General: Skin is warm and dry.   Neurological:      General: No focal deficit present.      Mental Status: She is alert.         DIAGNOSTIC RESULTS     EKG: All EKG's are interpreted by the Emergency Department Physician who either signs or Co-signs this chart in the absence of a cardiologist.      RADIOLOGY:   Non-plain film images such as CT, Ultrasound and MRI are read by the radiologist. Plain radiographic images are visualized and preliminarily interpreted by the emergency physician with the below findings:    Interpretation per the Radiologist below, if available at the time of this note:    XR CHEST (2 VW)   Final Result   No lobar consolidation. No pleural effusion. No pneumothorax.            ED BEDSIDE ULTRASOUND:   Performed by ED Physician - none    LABS:  Results for orders placed or performed during the hospital encounter of 08/02/23   COVID-19 & Influenza Combo [facility])     Specimen: Nasopharyngeal Swab   Result Value Ref Range    SARS-CoV-2 Not Detected Not Detected    Influenza A Not Detected Not Detected    Influenza B Not Detected Not Detected   EKG 12 Lead (Chest Pain)   Result Value Ref Range    Ventricular Rate 85 BPM    P-R Interval 182 ms    QRS Duration 76 ms    Q-T Interval 360 ms    QTc Calculation (Bazett) 402 ms    P Axis 33 degrees    R Axis 54 degrees    T Axis 52 degrees    Diagnosis SINUS RHYTHM  NORMAL ECG           EMERGENCY DEPARTMENT COURSE and DIFFERENTIAL DIAGNOSIS/MDM:   Vitals:    Vitals:    08/02/23 0943 08/02/23 1033 08/02/23 1100 08/02/23 1130   BP: (!) 181/109 (!) 158/103 (!) 156/93 (!) 143/115   Pulse: 91 90 81 84   Resp: 20 18 12 21    Temp: 98.2 F (36.8 C)      TempSrc: Oral      SpO2: 98% 94% 94% 97%   Weight: 127 kg (280 lb)      Height: 1.702 m (5\' 7" )          MDM  Number of Diagnoses or Management Options  Acute bronchospasm  Viral URI  Diagnosis management comments: Patient presented with chest symptoms for a week reproducible chest wall tenderness.  Has been coughing with her son also coughing at home.  She is afebrile but has some mild wheezing on exam.  She felt better after albuterol treatment here with COVID and flu negative and chest x-ray negative.  Recommend treatment  for bronchospasm secondary to viral etiology.  No indication for antibiotics        SCREENINGS       Glasgow Coma Scale  Eye Opening: Spontaneous  Best Verbal Response: Oriented  Best Motor Response: Obeys commands  Glasgow Coma Scale Score: 15             CIWA Assessment  BP: (!) 143/115  Pulse: 84             REASSESSMENT     ED Course as of 08/02/23 1231   Sat Aug 02, 2023   1057 EKG 12 Lead (Chest Pain)  EKG by my review interpretation demonstrates a sinus rhythm rate of 85 bpm with normal axis.  No acute ST or T wave changes [JC]      ED Course User Index  [JC] Lucien Mons, MD         CONSULTS:  None    PROCEDURES:  Unless otherwise noted below, none      Procedures      FINAL IMPRESSION      1. Acute bronchospasm    2. Viral URI          DISPOSITION/PLAN   DISPOSITION Decision To Discharge 08/02/2023 12:30:09 PM      PATIENT REFERRED TO:  Unknown, Provider, APRN - NP    In 1 week  For further evaluation of your condition    [facility] EMERGENCY DEPT  [address]  [place] [address]  [phone]    As needed, If symptoms worsen      DISCHARGE MEDICATIONS:  New Prescriptions    ALBUTEROL SULFATE HFA (PROVENTIL;VENTOLIN;PROAIR) 108 (90 BASE) MCG/ACT INHALER    Inhale 2 puffs into the lungs every 6 hours as needed for Wheezing or Shortness of Breath    BENZONATATE (TESSALON PERLES) 100 MG CAPSULE    Take 1 capsule by mouth 3 times daily as needed for Cough    GUAIFENESIN (MUCINEX) 600 MG EXTENDED RELEASE TABLET    Take 1 tablet by mouth 2 times daily for 10 days    PREDNISONE (DELTASONE) 20 MG TABLET    Take 2 tablets by mouth daily for 4 days     Controlled Substances Monitoring:          No data to display                (Please note that portions of this note were completed with a voice recognition program.  Efforts were made to edit the dictations but occasionally words are mis-transcribed.)    Lucien Mons, MD (electronically signed)  Attending Emergency Physician            Lucien Mons, MD  08/02/23 1231
